# Patient Record
Sex: Male | Born: 1965 | Race: White | Hispanic: No | Smoking: Current every day smoker
Health system: Southern US, Community
[De-identification: ages and names within clinical notes are randomized; demographics above are authoritative.]

## PROBLEM LIST (undated history)

## (undated) DIAGNOSIS — K219 Gastro-esophageal reflux disease without esophagitis: Secondary | ICD-10-CM

## (undated) DIAGNOSIS — I639 Cerebral infarction, unspecified: Secondary | ICD-10-CM

## (undated) DIAGNOSIS — F1020 Alcohol dependence, uncomplicated: Secondary | ICD-10-CM

## (undated) DIAGNOSIS — R011 Cardiac murmur, unspecified: Secondary | ICD-10-CM

## (undated) DIAGNOSIS — R569 Unspecified convulsions: Secondary | ICD-10-CM

## (undated) HISTORY — DX: Alcohol dependence, uncomplicated: F10.20

## (undated) HISTORY — PX: CERVICAL LAMINECTOMY: SHX94

## (undated) HISTORY — DX: Cerebral infarction, unspecified: I63.9

## (undated) HISTORY — PX: BACK SURGERY: SHX140

---

## 2003-04-15 ENCOUNTER — Emergency Department (HOSPITAL_COMMUNITY): Admission: AD | Admit: 2003-04-15 | Discharge: 2003-04-15 | Payer: Self-pay | Admitting: Family Medicine

## 2003-06-09 ENCOUNTER — Ambulatory Visit (HOSPITAL_COMMUNITY): Admission: RE | Admit: 2003-06-09 | Discharge: 2003-06-09 | Payer: Self-pay | Admitting: Orthopedic Surgery

## 2003-07-02 ENCOUNTER — Ambulatory Visit (HOSPITAL_COMMUNITY): Admission: RE | Admit: 2003-07-02 | Discharge: 2003-07-03 | Payer: Self-pay | Admitting: Orthopaedic Surgery

## 2004-05-04 ENCOUNTER — Ambulatory Visit: Payer: Self-pay | Admitting: Family Medicine

## 2005-11-02 ENCOUNTER — Emergency Department (HOSPITAL_COMMUNITY): Admission: EM | Admit: 2005-11-02 | Discharge: 2005-11-03 | Payer: Self-pay | Admitting: Emergency Medicine

## 2005-11-03 ENCOUNTER — Emergency Department (HOSPITAL_COMMUNITY): Admission: EM | Admit: 2005-11-03 | Discharge: 2005-11-03 | Payer: Self-pay | Admitting: Emergency Medicine

## 2007-08-27 ENCOUNTER — Ambulatory Visit: Payer: Self-pay | Admitting: Gastroenterology

## 2007-08-27 LAB — CONVERTED CEMR LAB
Angiotensin 1 Converting Enzyme: 34 units/L (ref 9–67)
Anti Nuclear Antibody(ANA): NEGATIVE
Ferritin: 919.9 ng/mL — ABNORMAL HIGH (ref 22.0–322.0)
INR: 0.9 (ref 0.8–1.0)
Iron: 103 ug/dL (ref 42–165)
Saturation Ratios: 25.9 % (ref 20.0–50.0)
Transferrin: 284.4 mg/dL (ref >=212.0)

## 2007-08-28 ENCOUNTER — Ambulatory Visit: Payer: Self-pay | Admitting: Gastroenterology

## 2007-08-29 ENCOUNTER — Ambulatory Visit (HOSPITAL_COMMUNITY): Admission: RE | Admit: 2007-08-29 | Discharge: 2007-08-29 | Payer: Self-pay | Admitting: Gastroenterology

## 2007-09-27 ENCOUNTER — Ambulatory Visit: Payer: Self-pay | Admitting: Gastroenterology

## 2008-08-28 ENCOUNTER — Emergency Department (HOSPITAL_BASED_OUTPATIENT_CLINIC_OR_DEPARTMENT_OTHER): Admission: EM | Admit: 2008-08-28 | Discharge: 2008-08-28 | Payer: Self-pay | Admitting: Emergency Medicine

## 2008-08-28 ENCOUNTER — Ambulatory Visit: Payer: Self-pay | Admitting: Diagnostic Radiology

## 2010-08-31 LAB — POCT TOXICOLOGY PANEL

## 2010-08-31 LAB — ETHANOL: Alcohol, Ethyl (B): 286 mg/dL — ABNORMAL HIGH (ref 0–10)

## 2010-10-04 NOTE — Assessment & Plan Note (Signed)
[facility]                         GASTROENTEROLOGY OFFICE NOTE   THAD, OSORIA                       MRN:          [id number]  DATE:08/27/2007                            DOB:          Nov 09, 1965    REFERRING PHYSICIAN:  Prudy Feeler, PA-C   REASON FOR CONSULT:  Abnormal liver function tests and reflux symptoms.   HISTORY OF PRESENT ILLNESS:  Mr. Staples is a 45 year old white male who  states he had mild liver function test abnormalities noted in 2007  during an emergency room evaluation for a seizure.  Blood work from  08/08/2007, showed an AST of 301 and an ALT of 188.  The remainder of  his liver function tests were normal.  His cholesterol was elevated at  267 with an HDL of 106 and an LDL of 147.  His CBC, electrolytes and TSH  were unremarkable.  He has no known history of liver disease and there  is no family history of liver disease.  He notes no history of jaundice,  hepatitis, exposure to individuals with jaundice or hepatitis, blood  transfusions or intravenous drug usage.  He is a heavy alcohol drinker  and states he drinks about 12 beers every day.  He states part of the  reason he does this is to control anxiety.  He has had worsening  problems with reflux symptoms over the past year and has tried Prilosec  OTC and ranitidine OTC with only fair results.  He was recently started  on Kapidex which led to severe diarrhea and he noted a small amount of  rectal bleeding with the diarrhea on one occasion.  He discontinued  Kapidex and his diarrhea and rectal bleeding resolved.  Kapidex was very  effective in controlling his reflux symptoms.  He relates a history of  ulcer disease several years ago but does not recall an EGD or UGI  series. He has no history of weight loss, change in appetite, jaundice,  light colored stool, melena, dysphagia, odynophagia, chest pain or  abdominal pain.   FAMILY HISTORY:  Negative for colon cancer,  colon polyps, inflammatory  bowel disease and liver disease.   PAST MEDICAL HISTORY:  1. Hypertension.  2. Hyperlipidemia.  3. Seizure in 2007.  4. History of ulcer disease.   CURRENT MEDICATION:  Celexa 20 mg daily.   MEDICATION ALLERGIES:  1. PENICILLIN.  2. KAPIDEX LEADING TO DIARRHEA.   SOCIAL HISTORY AND REVIEW OF SYSTEMS:  Per the handwritten form.   PHYSICAL EXAM:  A well-developed, well-nourished white male appearing  older than his stated age, weight 155 pounds, height 6 feet 1 inch,  blood pressure is 120/88, pulse 72 and regular.  HEENT EXAM:  Anicteric sclera.  OROPHARYNX:  Clear.  CHEST:  Clear to auscultation bilaterally.  CARDIAC:  Regular rate and rhythm without murmurs appreciated.  ABDOMEN:  Soft, nontender, nondistended, normoactive bowel sounds, no  palpable organomegaly, masses or hernias.  EXTREMITIES:  Without clubbing, cyanosis or edema.  NEUROLOGIC:  Alert and oriented x3.  Grossly nonfocal.   ASSESSMENT AND PLAN:  1. Abnormal liver function test.  The pattern is not typical for      alcohol-induced liver disease but this is a likely contributory      factor.  Rule out fatty infiltration of the liver due to      hyperlipidemia and alcoholism.  Rule out chronic viral hepatitis as      well as genetic and metabolic disease of the liver.  Obtain all      standard serology.  Schedule abdominal ultrasound.  He is strongly      advised to gradually decrease his alcohol usage and to return to      Prudy Feeler, PA-C, for long term management of his alcoholism and      alcohol abstinence.  He is not advised to abruptly discontinue      alcohol on his own due to the rist of withdrawl problems. It      appears likely his anxiety contributes to his alcohol abuse and      this needs future management with Prudy Feeler, PAC.  2. Gastroesophageal reflux disease.  Begin standard antireflux      measures.  Begin pantoprazole 40 mg p.o. q.a.m. taken 30 minutes       before breakfast.  Rule out erosive esophagitis and ulcer disease.      Risks, benefits and alternatives to upper endoscopy with possible      biopsy discussed with the patient and he consents to proceed, this      will be scheduled electively.     Venita Lick. Russella Dar, MD, [facility]  Electronically Signed    MTS/MedQ  DD: 08/27/2007  DT: 08/27/2007  Job #: [id number]   cc:   Prudy Feeler, PA-C

## 2010-10-07 NOTE — Op Note (Signed)
Mitchell May, Mitchell May NO.:  [id number]   MEDICAL RECORD NO.:  [id number]                   PATIENT TYPE:  OIB   LOCATION:  5033                                 FACILITY:  [facility]   PHYSICIAN:  Sharolyn Douglas, M.D.                     DATE OF BIRTH:  27-Apr-1966   DATE OF PROCEDURE:  07/02/2003  DATE OF DISCHARGE:                                 OPERATIVE REPORT   PREOPERATIVE DIAGNOSIS:  Cervical spondylotic radiculopathy C5-C6.   POSTOPERATIVE DIAGNOSIS:  Cervical spondylotic radiculopathy C5-C6.   PROCEDURE:  1. Anterior cervical discectomy C5-C6.  2. Anterior cervical arthrodesis C5-C6 with placement of a 7 mm Nuvasive     allograft prosthesis spacer packed with local autogenous bone graft.  3. Anterior cervical plating C5-C6 utilizing the Spinal Concepts System.   SURGEON:  Sharolyn Douglas, M.D.   ASSISTANT:  Verlin Fester, P.A.   ANESTHESIA:  General endotracheal anesthesia.   COMPLICATIONS:  None.   INDICATIONS FOR PROCEDURE:  The patient is a 45 year old male with right  upper extremity radiculopathy for 4+ months.  Plane radiographs demonstrate  degenerative changes at C5-C6.  MRI scan shows advanced degenerative changes  at C5-C6 with bilateral foraminal narrowing.  There is an HNP at C3-C4 on  the left.  The risks, benefits, and alternatives to ACDF at C5-C6 were  reviewed with the patient.  He understands that he may have persistent  symptoms from the C3-C4 level.  In addition, he is at risk for adjacent  sandwich degeneration.  Nevertheless, he is unable to tolerate his current  symptoms and would like to proceed with surgery.   PROCEDURE:  The patient was properly identified in the holding area and  taken to the operating room.  He underwent general endotracheal anesthesia  without difficulty.  He was carefully positioned on the operating table  using the [place]field head rest.  His neck was placed in slight extension.  He  was placed in 5  pounds of halter traction.  The neck was prepped and draped  in the usual sterile fashion.  A 3 cm incision was made on the left side of  the neck at the level of the cricoid cartilage in a natural skin crease.  Dissection was carried sharply through the platysma.  The interval between  the sternocleidomastoid muscle and the strap muscles medially was developed  down to the prevertebral space.  The C5-C6 level was easily identified by  the large anterior osteophyte.  A spinal needle was placed and x-ray taken  to confirm location.  The esophagus, trachea, and carotid sheath were  identified and protected at all times.  A Leksell rongeur was utilized to  remove the anterior osteophytes at C5-C6.  Caspar distraction pins were  placed in the C5 and C6 vertebral bodies.  The longus colli muscle elevated  out of the C5-C6 disc space.  The disc space was found to be narrowed and  degenerative.  The discectomy was carried back to the posterior longitudinal  ligament.  We encountered large hypertrophic uncovertebral joint.  The  joints were taken down using a high speed bur.  The cartilaginous endplates  also burred down to bleeding cancellous bone.  The posterior vertebral  margins were undercut using a 2 mm Kerrison.  The posterior longitudinal  ligament was taken down.  Wide foraminotomy was completed.  We then placed a  7 mm Nuvasive allograft prosthesis spacer packed with local autogenous bone  graft from the bur shavings.  This was carefully counter sunk 1 mm.  We then  placed a 22 mm Spinal Concepts plate with four 14 mm screws.  We had  excellent screw purchase.  We insured the locking mechanism engaged.  The  wound was irrigated.  The esophagus, trachea, and carotid sheath were  evaluated and there were no apparent injuries.  A deep Penrose drain was  left in place.  The platysma was closed with an interrupted 2-0 Vicryl, the  subcutaneous layer closed with interrupted 3-0 Vicryl, followed  by a 4-0  subcuticular Vicryl suture on the skin.  Benzoin and Steri-Strips were  applied.  A sterile dressing was placed.  A soft collar was placed.  The  patient was extubated without difficulty, transferred to the recovery room,  able to move his upper and lower extremities, in stable condition.                                               Sharolyn Douglas, M.D.    MC/MEDQ  D:  07/02/2003  T:  07/02/2003  Job:  [id number]

## 2011-01-27 ENCOUNTER — Emergency Department (HOSPITAL_COMMUNITY)
Admission: EM | Admit: 2011-01-27 | Discharge: 2011-01-27 | Disposition: A | Discharge status: Home / Self Care | Payer: 59 | Attending: Emergency Medicine | Admitting: Emergency Medicine

## 2011-01-27 ENCOUNTER — Emergency Department (HOSPITAL_COMMUNITY): Payer: 59

## 2011-01-27 DIAGNOSIS — R5381 Other malaise: Secondary | ICD-10-CM | POA: Insufficient documentation

## 2011-01-27 DIAGNOSIS — I10 Essential (primary) hypertension: Secondary | ICD-10-CM | POA: Insufficient documentation

## 2011-01-27 DIAGNOSIS — F17200 Nicotine dependence, unspecified, uncomplicated: Secondary | ICD-10-CM | POA: Insufficient documentation

## 2011-01-27 DIAGNOSIS — R0789 Other chest pain: Secondary | ICD-10-CM | POA: Insufficient documentation

## 2011-01-27 DIAGNOSIS — R7309 Other abnormal glucose: Secondary | ICD-10-CM | POA: Insufficient documentation

## 2011-01-27 DIAGNOSIS — R61 Generalized hyperhidrosis: Secondary | ICD-10-CM | POA: Insufficient documentation

## 2011-01-27 DIAGNOSIS — E860 Dehydration: Secondary | ICD-10-CM | POA: Insufficient documentation

## 2011-01-27 DIAGNOSIS — F1010 Alcohol abuse, uncomplicated: Secondary | ICD-10-CM | POA: Insufficient documentation

## 2011-01-27 DIAGNOSIS — R062 Wheezing: Secondary | ICD-10-CM | POA: Insufficient documentation

## 2011-01-27 LAB — COMPREHENSIVE METABOLIC PANEL
BUN: 10 mg/dL (ref 6–23)
Calcium: 8.9 mg/dL (ref 8.4–10.5)
GFR calc Af Amer: >60 mL/min (ref >60)
Glucose, Bld: 191 mg/dL — ABNORMAL HIGH (ref 70–99)
Sodium: 141 mEq/L (ref 135–145)
Total Protein: 7.7 g/dL (ref 6.0–8.3)

## 2011-01-27 LAB — CBC
MCH: 33.9 pg (ref 26.0–34.0)
MCV: 97.1 fL (ref 78.0–100.0)
Platelets: 195 10*3/uL (ref 150–400)
RDW: 14 % (ref 11.5–15.5)
WBC: 9.1 10*3/uL (ref 4.0–10.5)

## 2011-01-27 LAB — DIFFERENTIAL
Basophils Relative: 1 % (ref 0–1)
Eosinophils Absolute: 0 10*3/uL (ref 0.0–0.7)
Eosinophils Relative: 0 % (ref 0–5)
Lymphs Abs: 1.2 10*3/uL (ref 0.7–4.0)

## 2011-01-27 LAB — URINALYSIS, ROUTINE W REFLEX MICROSCOPIC
Nitrite: NEGATIVE
Specific Gravity, Urine: 1.023 (ref 1.005–1.030)
Urobilinogen, UA: 0.2 mg/dL (ref 0.0–1.0)

## 2011-01-27 LAB — RAPID URINE DRUG SCREEN, HOSP PERFORMED
Amphetamines: NOT DETECTED
Tetrahydrocannabinol: NOT DETECTED

## 2011-01-27 LAB — ETHANOL: Alcohol, Ethyl (B): 129 mg/dL — ABNORMAL HIGH (ref 0–11)

## 2011-01-27 LAB — URINE MICROSCOPIC-ADD ON

## 2011-08-24 ENCOUNTER — Other Ambulatory Visit: Payer: Self-pay | Admitting: Neurosurgery

## 2011-08-25 ENCOUNTER — Encounter (HOSPITAL_COMMUNITY): Payer: Self-pay | Admitting: Pharmacy Technician

## 2011-09-01 ENCOUNTER — Encounter (HOSPITAL_COMMUNITY): Payer: Self-pay

## 2011-09-01 ENCOUNTER — Encounter (HOSPITAL_COMMUNITY)
Admission: RE | Admit: 2011-09-01 | Discharge: 2011-09-01 | Disposition: A | Discharge status: Home / Self Care | Payer: 59 | Source: Ambulatory Visit | Attending: Neurosurgery | Admitting: Neurosurgery

## 2011-09-01 HISTORY — DX: Unspecified convulsions: R56.9

## 2011-09-01 HISTORY — DX: Gastro-esophageal reflux disease without esophagitis: K21.9

## 2011-09-01 HISTORY — DX: Cardiac murmur, unspecified: R01.1

## 2011-09-01 LAB — CBC
HCT: 40.2 % (ref 39.0–52.0)
Hemoglobin: 14.2 g/dL (ref 13.0–17.0)
MCHC: 35.3 g/dL (ref 30.0–36.0)
MCV: 93.5 fL (ref 78.0–100.0)
RDW: 13.8 % (ref 11.5–15.5)

## 2011-09-01 LAB — SURGICAL PCR SCREEN
MRSA, PCR: NEGATIVE
Staphylococcus aureus: NEGATIVE

## 2011-09-01 LAB — BASIC METABOLIC PANEL
BUN: 10 mg/dL (ref 6–23)
Creatinine, Ser: 0.77 mg/dL (ref 0.50–1.35)
GFR calc non Af Amer: >90 mL/min (ref >90)
Glucose, Bld: 93 mg/dL (ref 70–99)
Potassium: 4 mEq/L (ref 3.5–5.1)

## 2011-09-01 LAB — TYPE AND SCREEN

## 2011-09-01 LAB — ABO/RH: ABO/RH(D): AB POS

## 2011-09-01 NOTE — Pre-Procedure Instructions (Addendum)
20 Mitchell May  09/01/2011   Your procedure is scheduled on: 09/07/11  Report to [facility] at 530 AM.  Call this number if you have problems the morning of surgery: [phone]   Remember:   Do not eat food:After Midnight.  May have clear liquids: up to 4 Hours before arrival. 130am  Clear liquids include soda, tea, black coffee, apple or grape juice, broth.  Take these medicines the morning of surgery with A SIP OF WATER hydrocodone, gabapentin STOP any aspirin , nsaids, herbal meds, blood thinners now   Do not wear jewelry, make-up or nail polish.  Do not wear lotions, powders, or perfumes. You may wear deodorant.  Do not shave 48 hours prior to surgery.  Do not bring valuables to the hospital.  Contacts, dentures or bridgework may not be worn into surgery.  Leave suitcase in the car. After surgery it may be brought to your room.  For patients admitted to the hospital, checkout time is 11:00 AM the day of discharge.   Patients discharged the day of surgery will not be allowed to drive home.  Name and phone number of your driver: [name] [phone] cell [phone] hm  Special Instructions: CHG Shower Use Special Wash: 1/2 bottle night before surgery and 1/2 bottle morning of surgery.   Please read over the following fact sheets that you were given: Pain Booklet, Coughing and Deep Breathing, Blood Transfusion Information, MRSA Information and Surgical Site Infection Prevention

## 2011-09-06 MED ORDER — CEFAZOLIN SODIUM 1-5 GM-% IV SOLN
1.0000 g | INTRAVENOUS | Status: DC
  Filled 2011-09-06: qty 50

## 2011-09-07 ENCOUNTER — Encounter (HOSPITAL_COMMUNITY)
Admission: RE | Disposition: A | Discharge status: Home / Self Care | Payer: Self-pay | Source: Ambulatory Visit | Attending: Neurosurgery

## 2011-09-07 ENCOUNTER — Ambulatory Visit (HOSPITAL_COMMUNITY): Payer: 59 | Admitting: Anesthesiology

## 2011-09-07 ENCOUNTER — Inpatient Hospital Stay (HOSPITAL_COMMUNITY)
Admission: RE | Admit: 2011-09-07 | Discharge: 2011-09-09 | DRG: 460 | Disposition: A | Discharge status: Home / Self Care | Payer: 59 | Source: Ambulatory Visit | Attending: Neurosurgery | Admitting: Neurosurgery

## 2011-09-07 ENCOUNTER — Encounter (HOSPITAL_COMMUNITY): Payer: Self-pay | Admitting: Anesthesiology

## 2011-09-07 ENCOUNTER — Ambulatory Visit (HOSPITAL_COMMUNITY): Payer: 59

## 2011-09-07 ENCOUNTER — Encounter (HOSPITAL_COMMUNITY): Payer: Self-pay | Admitting: *Deleted

## 2011-09-07 DIAGNOSIS — M51379 Other intervertebral disc degeneration, lumbosacral region without mention of lumbar back pain or lower extremity pain: Secondary | ICD-10-CM | POA: Diagnosis present

## 2011-09-07 DIAGNOSIS — F17200 Nicotine dependence, unspecified, uncomplicated: Secondary | ICD-10-CM | POA: Diagnosis present

## 2011-09-07 DIAGNOSIS — K219 Gastro-esophageal reflux disease without esophagitis: Secondary | ICD-10-CM | POA: Diagnosis present

## 2011-09-07 DIAGNOSIS — M47817 Spondylosis without myelopathy or radiculopathy, lumbosacral region: Principal | ICD-10-CM | POA: Diagnosis present

## 2011-09-07 DIAGNOSIS — F1010 Alcohol abuse, uncomplicated: Secondary | ICD-10-CM | POA: Diagnosis present

## 2011-09-07 DIAGNOSIS — M5137 Other intervertebral disc degeneration, lumbosacral region: Secondary | ICD-10-CM | POA: Diagnosis present

## 2011-09-07 DIAGNOSIS — Z01812 Encounter for preprocedural laboratory examination: Secondary | ICD-10-CM

## 2011-09-07 SURGERY — POSTERIOR LUMBAR FUSION 1 LEVEL
Anesthesia: General | Site: Spine Lumbar

## 2011-09-07 MED ORDER — SUCCINYLCHOLINE CHLORIDE 20 MG/ML IJ SOLN
INTRAMUSCULAR | Status: DC | PRN
  Administered 2011-09-07: 120 mg via INTRAVENOUS

## 2011-09-07 MED ORDER — GLYCOPYRROLATE 0.2 MG/ML IJ SOLN
INTRAMUSCULAR | Status: DC | PRN
  Administered 2011-09-07: .6 mg via INTRAVENOUS

## 2011-09-07 MED ORDER — NEOSTIGMINE METHYLSULFATE 1 MG/ML IJ SOLN
INTRAMUSCULAR | Status: DC | PRN
  Administered 2011-09-07: 4 mg via INTRAVENOUS

## 2011-09-07 MED ORDER — ALUM & MAG HYDROXIDE-SIMETH 200-200-20 MG/5ML PO SUSP: 30.0000 mL | Freq: Four times a day (QID) | ORAL | Status: DC | PRN

## 2011-09-07 MED ORDER — SODIUM CHLORIDE 0.9 % IJ SOLN
3.0000 mL | Freq: Two times a day (BID) | INTRAMUSCULAR | Status: DC
  Administered 2011-09-07 – 2011-09-08 (×4): 3 mL via INTRAVENOUS

## 2011-09-07 MED ORDER — MORPHINE SULFATE 4 MG/ML IJ SOLN
4.0000 mg | INTRAMUSCULAR | Status: DC | PRN
  Administered 2011-09-07: 4 mg via INTRAMUSCULAR
  Filled 2011-09-07: qty 1

## 2011-09-07 MED ORDER — PHENYLEPHRINE HCL 10 MG/ML IJ SOLN
10.0000 mg | INTRAVENOUS | Status: DC | PRN
  Administered 2011-09-07: 10 ug/min via INTRAVENOUS

## 2011-09-07 MED ORDER — ROCURONIUM BROMIDE 100 MG/10ML IV SOLN
INTRAVENOUS | Status: DC | PRN
  Administered 2011-09-07: 50 mg via INTRAVENOUS
  Administered 2011-09-07: 20 mg via INTRAVENOUS
  Administered 2011-09-07: 30 mg via INTRAVENOUS
  Administered 2011-09-07: 20 mg via INTRAVENOUS
  Administered 2011-09-07: 30 mg via INTRAVENOUS

## 2011-09-07 MED ORDER — MAGNESIUM HYDROXIDE 400 MG/5ML PO SUSP: 30.0000 mL | Freq: Every day | ORAL | Status: DC | PRN

## 2011-09-07 MED ORDER — ESMOLOL HCL 10 MG/ML IV SOLN
INTRAVENOUS | Status: DC | PRN
  Administered 2011-09-07: 30 mg via INTRAVENOUS

## 2011-09-07 MED ORDER — PROPOFOL 10 MG/ML IV EMUL
INTRAVENOUS | Status: DC | PRN
  Administered 2011-09-07: 100 mg via INTRAVENOUS

## 2011-09-07 MED ORDER — LIDOCAINE-EPINEPHRINE 1 %-1:100000 IJ SOLN
INTRAMUSCULAR | Status: DC | PRN
  Administered 2011-09-07: 15 mL

## 2011-09-07 MED ORDER — LIDOCAINE HCL (CARDIAC) 20 MG/ML IV SOLN
INTRAVENOUS | Status: DC | PRN
  Administered 2011-09-07: 100 mg via INTRAVENOUS

## 2011-09-07 MED ORDER — ACETAMINOPHEN 650 MG RE SUPP: 650.0000 mg | RECTAL | Status: DC | PRN

## 2011-09-07 MED ORDER — CYCLOBENZAPRINE HCL 10 MG PO TABS
10.0000 mg | ORAL_TABLET | Freq: Three times a day (TID) | ORAL | Status: DC | PRN
  Administered 2011-09-07 – 2011-09-08 (×2): 10 mg via ORAL
  Filled 2011-09-07 (×2): qty 1

## 2011-09-07 MED ORDER — MORPHINE SULFATE 2 MG/ML IJ SOLN: 0.0500 mg/kg | INTRAMUSCULAR | Status: DC | PRN

## 2011-09-07 MED ORDER — PHENYLEPHRINE HCL 10 MG/ML IJ SOLN
INTRAMUSCULAR | Status: DC | PRN
  Administered 2011-09-07 (×4): 80 ug via INTRAVENOUS

## 2011-09-07 MED ORDER — KETOROLAC TROMETHAMINE 30 MG/ML IJ SOLN: 30.0000 mg | Freq: Once | INTRAMUSCULAR | Status: DC

## 2011-09-07 MED ORDER — HYDROMORPHONE HCL PF 1 MG/ML IJ SOLN
INTRAMUSCULAR | Status: AC
  Filled 2011-09-07: qty 1

## 2011-09-07 MED ORDER — GABAPENTIN 300 MG PO CAPS
300.0000 mg | ORAL_CAPSULE | Freq: Two times a day (BID) | ORAL | Status: DC
  Administered 2011-09-07 – 2011-09-09 (×5): 300 mg via ORAL
  Filled 2011-09-07 (×6): qty 1

## 2011-09-07 MED ORDER — 0.9 % SODIUM CHLORIDE (POUR BTL) OPTIME
TOPICAL | Status: DC | PRN
  Administered 2011-09-07: 1000 mL

## 2011-09-07 MED ORDER — VANCOMYCIN HCL 1000 MG IV SOLR
1000.0000 mg | INTRAVENOUS | Status: DC | PRN
  Administered 2011-09-07: 1000 mg via INTRAVENOUS

## 2011-09-07 MED ORDER — SODIUM CHLORIDE 0.9 % IJ SOLN: 3.0000 mL | INTRAMUSCULAR | Status: DC | PRN

## 2011-09-07 MED ORDER — LACTATED RINGERS IV SOLN
INTRAVENOUS | Status: DC | PRN
  Administered 2011-09-07 (×2): via INTRAVENOUS

## 2011-09-07 MED ORDER — OXYCODONE-ACETAMINOPHEN 5-325 MG PO TABS
1.0000 | ORAL_TABLET | ORAL | Status: DC | PRN
  Administered 2011-09-07 – 2011-09-08 (×4): 2 via ORAL
  Filled 2011-09-07 (×4): qty 2

## 2011-09-07 MED ORDER — BACITRACIN 50000 UNITS IM SOLR
INTRAMUSCULAR | Status: AC
  Filled 2011-09-07: qty 1

## 2011-09-07 MED ORDER — ONDANSETRON HCL 4 MG/2ML IJ SOLN
INTRAMUSCULAR | Status: DC | PRN
  Administered 2011-09-07: 4 mg via INTRAVENOUS

## 2011-09-07 MED ORDER — PHENOL 1.4 % MT LIQD: 1.0000 | OROMUCOSAL | Status: DC | PRN

## 2011-09-07 MED ORDER — HYDROCODONE-ACETAMINOPHEN 5-325 MG PO TABS
1.0000 | ORAL_TABLET | ORAL | Status: DC | PRN
  Administered 2011-09-08 – 2011-09-09 (×3): 2 via ORAL
  Filled 2011-09-07 (×3): qty 2

## 2011-09-07 MED ORDER — BISACODYL 10 MG RE SUPP: 10.0000 mg | Freq: Every day | RECTAL | Status: DC | PRN

## 2011-09-07 MED ORDER — ZOLPIDEM TARTRATE 5 MG PO TABS: 10.0000 mg | ORAL_TABLET | Freq: Every evening | ORAL | Status: DC | PRN

## 2011-09-07 MED ORDER — ACETAMINOPHEN 325 MG PO TABS
650.0000 mg | ORAL_TABLET | ORAL | Status: DC | PRN
  Administered 2011-09-08: 650 mg via ORAL
  Filled 2011-09-07: qty 2

## 2011-09-07 MED ORDER — GENTAMICIN SULFATE 40 MG/ML IJ SOLN
INTRAVENOUS | Status: DC | PRN
  Administered 2011-09-07: 80 mL via INTRAVENOUS

## 2011-09-07 MED ORDER — BUPIVACAINE HCL (PF) 0.5 % IJ SOLN
INTRAMUSCULAR | Status: DC | PRN
  Administered 2011-09-07: 15 mL

## 2011-09-07 MED ORDER — SPIRITUS FRUMENTI
1.0000 | Freq: Three times a day (TID) | ORAL | Status: DC
  Administered 2011-09-07 – 2011-09-09 (×7): 1 via ORAL
  Filled 2011-09-07 (×16): qty 1

## 2011-09-07 MED ORDER — ONDANSETRON HCL 4 MG/2ML IJ SOLN: 4.0000 mg | Freq: Once | INTRAMUSCULAR | Status: DC | PRN

## 2011-09-07 MED ORDER — VANCOMYCIN HCL IN DEXTROSE 1-5 GM/200ML-% IV SOLN
INTRAVENOUS | Status: AC
  Filled 2011-09-07: qty 200

## 2011-09-07 MED ORDER — MIDAZOLAM HCL 5 MG/5ML IJ SOLN
INTRAMUSCULAR | Status: DC | PRN
  Administered 2011-09-07: 2 mg via INTRAVENOUS

## 2011-09-07 MED ORDER — KCL IN DEXTROSE-NACL 20-5-0.45 MEQ/L-%-% IV SOLN
INTRAVENOUS | Status: DC
  Filled 2011-09-07 (×8): qty 1000

## 2011-09-07 MED ORDER — HYDROXYZINE HCL 25 MG PO TABS: 50.0000 mg | ORAL_TABLET | ORAL | Status: DC | PRN

## 2011-09-07 MED ORDER — HYDROMORPHONE HCL PF 1 MG/ML IJ SOLN
0.2500 mg | INTRAMUSCULAR | Status: DC | PRN
  Administered 2011-09-07 (×2): 0.5 mg via INTRAVENOUS

## 2011-09-07 MED ORDER — GENTAMICIN IN SALINE 0.8-0.9 MG/ML-% IV SOLN
80.0000 mg | INTRAVENOUS | Status: DC
  Filled 2011-09-07: qty 100

## 2011-09-07 MED ORDER — FENTANYL CITRATE 0.05 MG/ML IJ SOLN
INTRAMUSCULAR | Status: DC | PRN
  Administered 2011-09-07: 50 ug via INTRAVENOUS
  Administered 2011-09-07: 100 ug via INTRAVENOUS
  Administered 2011-09-07: 50 ug via INTRAVENOUS
  Administered 2011-09-07: 150 ug via INTRAVENOUS
  Administered 2011-09-07: 50 ug via INTRAVENOUS
  Administered 2011-09-07: 100 ug via INTRAVENOUS

## 2011-09-07 MED ORDER — THROMBIN 20000 UNITS EX KIT
PACK | CUTANEOUS | Status: DC | PRN
  Administered 2011-09-07: 09:00:00 via TOPICAL

## 2011-09-07 MED ORDER — KETOROLAC TROMETHAMINE 30 MG/ML IJ SOLN
30.0000 mg | Freq: Four times a day (QID) | INTRAMUSCULAR | Status: DC
  Administered 2011-09-07 – 2011-09-09 (×7): 30 mg via INTRAVENOUS
  Filled 2011-09-07 (×8): qty 1

## 2011-09-07 MED ORDER — MENTHOL 3 MG MT LOZG: 1.0000 | LOZENGE | OROMUCOSAL | Status: DC | PRN

## 2011-09-07 MED ORDER — HYDROXYZINE HCL 50 MG/ML IM SOLN: 50.0000 mg | INTRAMUSCULAR | Status: DC | PRN

## 2011-09-07 MED ORDER — SODIUM CHLORIDE 0.9 % IR SOLN
Status: DC | PRN
  Administered 2011-09-07: 09:00:00

## 2011-09-07 MED ORDER — SODIUM CHLORIDE 0.9 % IV SOLN
INTRAVENOUS | Status: AC
  Filled 2011-09-07: qty 500

## 2011-09-07 SURGICAL SUPPLY — 75 items
ADH SKN CLS APL DERMABOND .7 (GAUZE/BANDAGES/DRESSINGS) ×1
ADH SKN CLS LQ APL DERMABOND (GAUZE/BANDAGES/DRESSINGS) ×1
BAG DECANTER FOR FLEXI CONT (MISCELLANEOUS) ×2 IMPLANT
BLADE SURG ROTATE 9660 (MISCELLANEOUS) IMPLANT
BRUSH SCRUB EZ PLAIN DRY (MISCELLANEOUS) ×2 IMPLANT
BUR ACRON 5.0MM COATED (BURR) ×2 IMPLANT
BUR MATCHSTICK NEURO 3.0 LAGG (BURR) ×2 IMPLANT
CANISTER SUCTION 2500CC (MISCELLANEOUS) ×2 IMPLANT
CAP LCK SPNE (Orthopedic Implant) ×4 IMPLANT
CAP LOCK SPINE RADIUS (Orthopedic Implant) IMPLANT
CAP LOCKING (Orthopedic Implant) ×8 IMPLANT
CLOTH BEACON ORANGE TIMEOUT ST (SAFETY) ×2 IMPLANT
CONT SPEC 4OZ CLIKSEAL STRL BL (MISCELLANEOUS) ×4 IMPLANT
COVER BACK TABLE 24X17X13 BIG (DRAPES) ×1 IMPLANT
COVER TABLE BACK 60X90 (DRAPES) ×1 IMPLANT
DERMABOND ADHESIVE PROPEN (GAUZE/BANDAGES/DRESSINGS) ×1
DERMABOND ADVANCED (GAUZE/BANDAGES/DRESSINGS) ×1
DERMABOND ADVANCED .7 DNX12 (GAUZE/BANDAGES/DRESSINGS) ×1 IMPLANT
DERMABOND ADVANCED .7 DNX6 (GAUZE/BANDAGES/DRESSINGS) ×1 IMPLANT
DRAPE C-ARM 42X72 X-RAY (DRAPES) ×4 IMPLANT
DRAPE LAPAROTOMY 100X72X124 (DRAPES) ×2 IMPLANT
DRAPE POUCH INSTRU U-SHP 10X18 (DRAPES) ×2 IMPLANT
DRAPE PROXIMA HALF (DRAPES) ×1 IMPLANT
DRSG EMULSION OIL 3X3 NADH (GAUZE/BANDAGES/DRESSINGS) IMPLANT
ELECT REM PT RETURN 9FT ADLT (ELECTROSURGICAL) ×2
ELECTRODE REM PT RTRN 9FT ADLT (ELECTROSURGICAL) ×1 IMPLANT
GAUZE SPONGE 4X4 16PLY XRAY LF (GAUZE/BANDAGES/DRESSINGS) IMPLANT
GLOVE BIOGEL PI IND STRL 7.0 (GLOVE) IMPLANT
GLOVE BIOGEL PI IND STRL 8 (GLOVE) ×2 IMPLANT
GLOVE BIOGEL PI INDICATOR 7.0 (GLOVE) ×3
GLOVE BIOGEL PI INDICATOR 8 (GLOVE) ×2
GLOVE ECLIPSE 7.5 STRL STRAW (GLOVE) ×6 IMPLANT
GLOVE EXAM NITRILE LRG STRL (GLOVE) IMPLANT
GLOVE EXAM NITRILE MD LF STRL (GLOVE) ×2 IMPLANT
GLOVE EXAM NITRILE XL STR (GLOVE) IMPLANT
GLOVE EXAM NITRILE XS STR PU (GLOVE) IMPLANT
GLOVE SURG SS PI 6.5 STRL IVOR (GLOVE) ×6 IMPLANT
GLOVE SURG SS PI 7.0 STRL IVOR (GLOVE) ×2 IMPLANT
GOWN BRE IMP SLV AUR LG STRL (GOWN DISPOSABLE) ×5 IMPLANT
GOWN BRE IMP SLV AUR XL STRL (GOWN DISPOSABLE) ×3 IMPLANT
GOWN STRL REIN 2XL LVL4 (GOWN DISPOSABLE) IMPLANT
IMBIBE BONE MARROW ASPIRATION NEEDLE 8GAX15CM ×1 IMPLANT
KIT BASIN OR (CUSTOM PROCEDURE TRAY) ×2 IMPLANT
KIT ROOM TURNOVER OR (KITS) ×2 IMPLANT
MILL MEDIUM DISP (BLADE) ×2 IMPLANT
NDL HYPO 25X1 1.5 SAFETY (NEEDLE) ×1 IMPLANT
NDL SPNL 22GX3.5 QUINCKE BK (NEEDLE) ×1 IMPLANT
NEEDLE BONE MARROW 8GAX6 (NEEDLE) ×1 IMPLANT
NEEDLE HYPO 25X1 1.5 SAFETY (NEEDLE) ×2 IMPLANT
NEEDLE SPNL 18GX3.5 QUINCKE PK (NEEDLE) ×2 IMPLANT
NEEDLE SPNL 22GX3.5 QUINCKE BK (NEEDLE) IMPLANT
NS IRRIG 1000ML POUR BTL (IV SOLUTION) ×2 IMPLANT
PACK LAMINECTOMY NEURO (CUSTOM PROCEDURE TRAY) ×2 IMPLANT
PAD ARMBOARD 7.5X6 YLW CONV (MISCELLANEOUS) ×8 IMPLANT
PATTIES SURGICAL .5 X.5 (GAUZE/BANDAGES/DRESSINGS) IMPLANT
PATTIES SURGICAL .5 X1 (DISPOSABLE) IMPLANT
PATTIES SURGICAL 1X1 (DISPOSABLE) IMPLANT
PEEK PLIF AVS 14X20X8 (Peek) ×4 IMPLANT
ROD RADIUS 35MM (Rod) ×2 IMPLANT
SCREW 6.75X40MM (Screw) ×2 IMPLANT
SCREW RADIUS 5.75X55MM (Screw) ×2 IMPLANT
SPONGE GAUZE 4X4 12PLY (GAUZE/BANDAGES/DRESSINGS) ×2 IMPLANT
SPONGE LAP 4X18 X RAY DECT (DISPOSABLE) IMPLANT
SPONGE NEURO XRAY DETECT 1X3 (DISPOSABLE) ×2 IMPLANT
SPONGE SURGIFOAM ABS GEL 100 (HEMOSTASIS) ×2 IMPLANT
STRIP BIOACTIVE VITOSS 25X100X (Neuro Prosthesis/Implant) ×1 IMPLANT
SUT VIC AB 1 CT1 18XBRD ANBCTR (SUTURE) ×2 IMPLANT
SUT VIC AB 1 CT1 8-18 (SUTURE) ×4
SUT VIC AB 2-0 CP2 18 (SUTURE) ×4 IMPLANT
SYR 20ML ECCENTRIC (SYRINGE) ×2 IMPLANT
TAPE CLOTH SURG 4X10 WHT LF (GAUZE/BANDAGES/DRESSINGS) ×1 IMPLANT
TOWEL OR 17X24 6PK STRL BLUE (TOWEL DISPOSABLE) ×2 IMPLANT
TOWEL OR 17X26 10 PK STRL BLUE (TOWEL DISPOSABLE) ×2 IMPLANT
TRAY FOLEY CATH 14FRSI W/METER (CATHETERS) ×2 IMPLANT
WATER STERILE IRR 1000ML POUR (IV SOLUTION) ×2 IMPLANT

## 2011-09-07 NOTE — H&P (Signed)
Subjective: Patient is a 46 y.o. 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 He is admitted for an L5 Gill procedure, and bilateral L5-S1 posterior lumbar interbody arthrodesis with interbody implants and bone graft and a bilateral L5-S1 posterior lateral arthrodesis with with posterior instrumentation and bone graft. Patient's been having difficulties with back pain for several years worse over the past year. He has undergone nonsurgical management with medications and spinal injections without relief.   Past Medical History  Diagnosis Date  . Heart murmur   . GERD (gastroesophageal reflux disease)   . Seizures     one >10 yrs ? reason    Past Surgical History  Procedure Date  . Cervical laminectomy     Prescriptions prior to admission  Medication Sig Dispense Refill  . gabapentin (NEURONTIN) 300 MG capsule Take 300 mg by mouth 2 (two) times daily as needed. For nerve pain      . HYDROcodone-acetaminophen (NORCO) 10-325 MG per tablet Take 1 tablet by mouth every 6 (six) hours as needed. For pain      . nabumetone (RELAFEN) 500 MG tablet Take 500 mg by mouth as needed.       Allergies  Allergen Reactions  . Penicillins Other (See Comments)    Childhood reaction    History  Substance Use Topics  . Smoking status: Current Everyday Smoker -- 1.0 packs/day for 25 years    Types: Cigarettes  . Smokeless tobacco: Not on file   Comment: 6pk - 12pk a day beer  . Alcohol Use: Yes    History reviewed. No pertinent family history.   Review of Systems A comprehensive review of systems was negative.  Objective: Vital signs in last 24 hours: Temp:  [98.1 F (36.7 C)] 98.1 F (36.7 C) (04/18 0609) Pulse Rate:  [63] 63  (04/18 0609) Resp:  [16] 16  (04/18 0609) BP: (127)/(87) 127/87 mmHg (04/18 0609) SpO2:  [97 %] 97 % (04/18 0609)  EXAM: Patient is a thin man in no acute distress. Lungs are clear  to auscultation , the patient has symmetrical respiratory excursion. Heart has a regular rate and rhythm normal S1 and S2 no murmur.   Abdomen is soft nontender nondistended bowel sounds are present. Extremity examination shows no clubbing cyanosis or edema. Musculoskeletal examination shows tenderness to palpation middle lumbar sacral junction. He is able to flex to 90. He is able to extend to 10. Straight leg raising is negative bilaterally. Motor examination shows 5 over 5 strength in the lower extremities including the iliopsoas quadriceps dorsiflexor extensor hallicus  longus and plantar flexor bilaterally. Sensation is intact to pinprick in the distal lower extremities. Reflexes are symmetrical bilaterally. No pathologic reflexes are present. Patient has a normal gait and stance.   Data Review:CBC    Component Value Date/Time   WBC 10.7* 09/01/2011 0859   RBC 4.30 09/01/2011 0859   HGB 14.2 09/01/2011 0859   HCT 40.2 09/01/2011 0859   PLT 221 09/01/2011 0859   MCV 93.5 09/01/2011 0859   MCH 33.0 09/01/2011 0859   MCHC 35.3 09/01/2011 0859   RDW 13.8 09/01/2011 0859   LYMPHSABS 1.2 01/27/2011 1224   MONOABS 0.2 01/27/2011 1224   EOSABS 0.0 01/27/2011 1224   BASOSABS 0.1 01/27/2011 1224                          BMET  Component Value Date/Time   NA 139 09/01/2011 0859   K 4.0 09/01/2011 0859   CL 101 09/01/2011 0859   CO2 27 09/01/2011 0859   GLUCOSE 93 09/01/2011 0859   BUN 10 09/01/2011 0859   CREATININE 0.77 09/01/2011 0859   CALCIUM 9.1 09/01/2011 0859   GFRNONAA >90 09/01/2011 0859   GFRAA >90 09/01/2011 0859     Assessment/Plan: Patient with the L5-S1 spinal listhesis and at the bilateral L5 pars interarticularis defect. There is bilateral L5-S1 neural foraminal stenosis. The listhesis seems static through flexion-extension. Patient is admitted now for lumbar decompression and arthrodesis. I've discussed with the patient the nature of his condition, the nature the surgical procedure, the  typical length of surgery, hospital stay, and overall recuperation, the limitations postoperatively, and risks of surgery. I discussed risks including risks of infection, bleeding, possibly need for transfusion, the risk of nerve root dysfunction with pain, weakness, numbness, or paresthesias, the risk of dural tear and CSF leakage and possible need for further surgery, the risk of failure of the arthrodesis and possibly for further surgery, the risk of anesthetic complications including myocardial infarction, stroke, pneumonia, and death. We discussed the need for postoperative immobilization in a lumbar brace. Understanding all this the patient does wish to proceed with surgery and is admitted for such.     Hewitt Shorts, MD 09/07/2011 7:13 AM

## 2011-09-07 NOTE — Addendum Note (Signed)
Addendum  created 09/07/11 1305 by Jeani Hawking, CRNA   Modules edited:Anesthesia Medication Administration

## 2011-09-07 NOTE — Anesthesia Postprocedure Evaluation (Signed)
Anesthesia Post Note  Patient: Mitchell May  Procedure(s) Performed: Procedure(s) (LRB): POSTERIOR LUMBAR FUSION 1 LEVEL (N/A)  Anesthesia type: general  Patient location: PACU  Post pain: Pain level controlled  Post assessment: Patient's Cardiovascular Status Stable  Last Vitals:  Filed Vitals:   09/07/11 1233  BP:   Pulse: 99  Temp:   Resp: 15    Post vital signs: Reviewed and stable  Level of consciousness: sedated  Complications: No apparent anesthesia complications

## 2011-09-07 NOTE — Anesthesia Preprocedure Evaluation (Addendum)
Anesthesia Evaluation  Patient identified by MRN, date of birth, ID band Patient awake    Reviewed: Allergy & Precautions, H&P , NPO status , Patient's Chart, lab work & pertinent test results, reviewed documented beta blocker date and time   Airway Mallampati: II TM Distance: >3 FB Neck ROM: Limited    Dental  (+) Poor Dentition, Dental Advisory Given and Chipped   Pulmonary Current Smoker,          Cardiovascular negative cardio ROS  - Valvular Problems/Murmurs    Neuro/Psych Seizures -, Well Controlled,  negative psych ROS   GI/Hepatic Neg liver ROS, GERD-  Poorly Controlled,(+)     substance abuse (12 pack per day)  alcohol use,   Endo/Other  negative endocrine ROS  Renal/GU negative Renal ROS  negative genitourinary   Musculoskeletal   Abdominal   Peds  (+) Abnormal pediatric history - Hematology negative hematology ROS (+)   Anesthesia Other Findings   Reproductive/Obstetrics negative OB ROS                        Anesthesia Physical Anesthesia Plan  ASA: II  Anesthesia Plan: General   Post-op Pain Management:    Induction: Intravenous, Rapid sequence and Cricoid pressure planned  Airway Management Planned: Oral ETT  Additional Equipment:   Intra-op Plan:   Post-operative Plan: Extubation in OR  Informed Consent: I have reviewed the patients History and Physical, chart, labs and discussed the procedure including the risks, benefits and alternatives for the proposed anesthesia with the patient or authorized representative who has indicated his/her understanding and acceptance.   Dental advisory given  Plan Discussed with: CRNA and Surgeon  Anesthesia Plan Comments:       Anesthesia Quick Evaluation

## 2011-09-07 NOTE — Transfer of Care (Signed)
Immediate Anesthesia Transfer of Care Note  Patient: Mitchell May  Procedure(s) Performed: Procedure(s) (LRB): POSTERIOR LUMBAR FUSION 1 LEVEL (N/A)  Patient Location: PACU  Anesthesia Type: General  Level of Consciousness: awake, alert , oriented and patient cooperative  Airway & Oxygen Therapy: Patient Spontanous Breathing and Patient connected to face mask oxygen  Post-op Assessment: Report given to PACU RN and Post -op Vital signs reviewed and stable  Post vital signs: Reviewed and stable  Complications: No apparent anesthesia complications

## 2011-09-07 NOTE — Addendum Note (Signed)
Addendum  created 09/07/11 1305 by Falynn Ailey J Nabor Thomann, CRNA   Modules edited:Anesthesia Medication Administration    

## 2011-09-07 NOTE — Op Note (Signed)
09/07/2011  11:56 AM  PATIENT:  Mitchell May  46 y.o. male  PRE-OPERATIVE DIAGNOSIS:  L5 bilateral pars interarticularis defect, L5 on S1 spondylolisthesis, lumbar spondylosis, lumbar degenerative disc disease, lumbago  POST-OPERATIVE DIAGNOSIS: L5 bilateral pars interarticularis defect, L5 on S1 Spondylolisthesis, Lumbar Spondylosis,,Lumbar degenerative disc disease, lumbago  PROCEDURE:  Procedure(s): POSTERIOR LUMBAR FUSION 1 LEVEL: L5 Gill procedure, bilateral L5-S1 facetectomy and foraminotomies with decompression of the exiting L5 and S1 nerve roots bilaterally, with decompression beyond that required for interbody arthrodesis, with microdissection and microsurgical technique, with bilateral L5-S1 posterior lumbar interbody arthrodesis with AVS peek interbody implants, locally harvested morcellized autograft, and Vitoss with bone marrow aspirate, and bilateral L5-S1 posterior lateral arthrodesis with radius posterior instrumentation and Vitoss with bone marrow aspirate  SURGEON:  Surgeon(s): Hewitt Shorts, MD Clydene Fake, MD  ASSISTANTS: Clydene Fake, M.D.  ANESTHESIA:   general  EBL:  Total I/O In: 1900 [I.V.:1900] Out: 920 [Urine:700; Blood:220]  BLOOD ADMINISTERED:none  COUNT: Incorrect per nursing staff, an extra 4 x 18 sponge was found by the nursing staff during their final count at wound closure. Fluoroscopy images showed no sponges within the surgical wound.  DICTATION: Patient is brought to the operating room placed under general endotracheal anesthesia. The patient was turned to prone position the lumbar region was prepped with Betadine soap and solution and draped in a sterile fashion. The midline was infiltrated with local anesthesia with epinephrine. A localizing x-ray was taken and then a midline incision is made carried down to subcutaneous tissue bipolar cautery and electrocautery were used to maintain hemostasis dissection was carried out the lumbar  fascia. The fascia was incised bilaterally and the paraspinal muscles were dissected with a spinous process and lamina in a subperiosteal fashion. Another x-ray was taken for localization and the L5-S1 level was localized. Dissection was then carried out laterally over the facet complex and the transverse processes of L5 and the ala of S1 were exposed and decorticated. Decompression via an L5 Gill procedure was performed. We dissected around the loose posterior element of L5. It was freed up from the ligamentum flavum at the L4-5 as well as L5-S1 levels, and it was freed up from the pseudoarthrosis that was present. It is removed en bloc and cleaned of cartilage and soft tissue by the nursing staff and processed through a bone mill to be used as morcellized autograft. Dissection was carried out laterally including facetectomy and foraminotomies with decompression of the L5 nerve roots bilaterally. There was moderate pseudoarthrotic bone, cartilage, and ligament tissue that was overgrown and was carefully removed so as to decompress the exiting nerve roots. Once the decompression of the thecal sac and exiting nerve roots was completed we proceeded with the posterior lumbar interbody arthrodesis. The annulus was incised bilaterally and the disc space entered a thorough discectomy was performed using pituitary rongeurs and curettes. There was a significant persistent listhesis of L5 anteriorly on S1. Once the discectomy was completed we began to prepare the endplate surfaces removing the cartilaginous endplates surface with paddle curettes. We then measured the height of the intervertebral disc space. We selected 4 x 20 x 8 by 11 mm AVS peek interbody implants.  The C-arm fluoroscope was then draped and brought in the field and we identified the pedicle entry points bilaterally at the S1 levels. Each of the S1 pedicles was probed, we aspirated bone marrow aspirate from the vertebral bodies this was injected over a  10 cc strip of  Vitoss. Then each of the pedicles was examined with the ball probe good bony surfaces were found and no bony cuts were found. Each of the S1 pedicles was then tapped with a 6.25 mm tap, again examined with the ball probe good threading was found and no bony cuts were found. We then placed 6.75 by 40 millimeter screws bilaterally at the S1 level.  We then packed the AVS peek interbody implants with locally harvested morcellized autograft, and then placed the first implant and on the right side, carefully retracting the thecal sac and nerve root medially. We then went back to the left side and packed the midline with additional locally harvested morcellized autograft and then placed a second implant and on the left side again retracting the thecal sac and nerve root medially. Vitoss with bone marrow aspirate was packed lateral to the implants.  We then placed pedicle screws bilaterally at L5, using C-arm fluoroscopic guidance. Pedicle entry points were identified bilaterally at L5. Each of the pedicles was probed, and then tapped with a 5.25 mm tap, exam with a ball probe, good threading was found, and then we placed 5.75 x 55 mm screws bilaterally at L5.  We then packed the lateral gutter over the transverse processes and intertransverse space with Vitoss with bone marrow aspirate. We then selected a 35 mm pre-lordosed rods, they were placed within the screw heads and secured with locking caps once all 4 locking caps were placed final tightening was performed against a counter torque.  The wound had been irrigated multiple times during the procedure with saline solution and bacitracin solution, good hemostasis was established with a combination of bipolar cautery. Once good hemostasis was confirmed we proceeded with closure paraspinal muscles deep fascia and Scarpa's fascia were closed with interrupted undyed 1 Vicryl sutures the subcutaneous and subcuticular closed with interrupted inverted  2-0 undyed Vicryl sutures the skin edges were approximated with Dermabond. A dressing of dry gauze was applied once the Dermabond was dried.  Following surgery the patient was turned back to the supine position to be reversed and the anesthetic extubated and transferred to the recovery room for further care.   PLAN OF CARE: Admit to inpatient   PATIENT DISPOSITION:  PACU - hemodynamically stable.   Delay start of Pharmacological VTE agent (>24hrs) due to surgical blood loss or risk of bleeding:  yes

## 2011-09-07 NOTE — Progress Notes (Signed)
Filed Vitals:   09/07/11 1238 09/07/11 1239 09/07/11 1251 09/07/11 1307  BP:  105/60  103/71  Pulse: 94 92 91 85  Temp:   97.9 F (36.6 C) 97.8 F (36.6 C)  TempSrc:      Resp: 13 14 13 20   SpO2: 95% 94% 94% 96%    Patient seen on 3500 unit. Comfortable. Dressing clean dry. Mobilizing patient with staff to ambulate in the halls. They will DC Foley later today. Moving all extremities well.  Plan: To progress through postoperative recovery. We'll have dressing removed on second postoperative day (April 20).  Hewitt Shorts, MD 09/07/2011, 1:51 PM

## 2011-09-08 NOTE — Progress Notes (Signed)
Doing well. C/o appropriate incisional soreness. Less leg pain No Numbness, tingling, weakness No Nausea /vomiting Amb/ voiding well  Temp:  [97.8 F (36.6 C)-100.2 F (37.9 C)] 98.8 F (37.1 C) (04/19 0801) Pulse Rate:  [80-104] 88  (04/19 0801) Resp:  [13-20] 16  (04/19 0801) BP: (103-139)/(60-84) 123/84 mmHg (04/19 0801) SpO2:  [94 %-98 %] 95 % (04/19 0801) Good strength and sensation Incision CDI  Plan: Increase activity  , ?home in 1-2 days?

## 2011-09-08 NOTE — Progress Notes (Signed)
Utilization review completed. Aarik Blank, RN, BSN. 09/08/11 

## 2011-09-09 MED ORDER — OXYCODONE-ACETAMINOPHEN 5-325 MG PO TABS: 1.0000 | ORAL_TABLET | ORAL | Status: AC | PRN

## 2011-09-09 MED ORDER — CYCLOBENZAPRINE HCL 10 MG PO TABS: 10.0000 mg | ORAL_TABLET | Freq: Three times a day (TID) | ORAL | Status: AC | PRN

## 2011-09-09 NOTE — Discharge Summary (Signed)
Physician Discharge Summary  Patient ID: Mitchell May MRN: [id number] DOB/AGE: Jun 22, 1965 46 y.o.  Admit date: 09/07/2011 Discharge date: 09/09/2011  Admission Diagnoses: L5 bilateral pars interarticularis defect, L5 on S1 spondylolisthesis, lumbar spondylosis, lumbar degenerative disc disease, lumbago  Discharge Diagnoses: L5 bilateral pars interarticularis defect, L5 on S1 spondylolisthesis, lumbar spondylosis, lumbar degenerative disc disease, lumbago  Active Problems:  * No active hospital problems. *    Discharged Condition: good  Hospital Course: pt admitted and underwent surgery below.  Pt doing well with less leg pain. Ambulating, and voiding well  Consults: None  Significant Diagnostic Studies: none  Treatments: surgery: POSTERIOR LUMBAR FUSION 1 LEVEL: L5 Gill procedure, bilateral L5-S1 facetectomy and foraminotomies with decompression of the exiting L5 and S1 nerve roots bilaterally, with decompression beyond that required for interbody arthrodesis, with microdissection and microsurgical technique, with bilateral L5-S1 posterior lumbar interbody arthrodesis with AVS peek interbody implants, locally harvested morcellized autograft, and Vitoss with bone marrow aspirate, and bilateral L5-S1 posterior lateral arthrodesis with radius posterior instrumentation and Vitoss with bone marrow aspirate   Discharge Exam: Blood pressure 134/92, pulse 98, temperature 98 F (36.7 C), temperature source Oral, resp. rate 18, SpO2 98.00%. Wound:c/d/i  Disposition: home   Medication List  As of 09/09/2011  9:29 AM   STOP taking these medications         HYDROcodone-acetaminophen 10-325 MG per tablet      nabumetone 500 MG tablet         TAKE these medications         cyclobenzaprine 10 MG tablet   Commonly known as: FLEXERIL   Take 1 tablet (10 mg total) by mouth 3 (three) times daily as needed for muscle spasms.      gabapentin 300 MG capsule   Commonly known as: NEURONTIN    Take 300 mg by mouth 2 (two) times daily as needed. For nerve pain      oxyCODONE-acetaminophen 5-325 MG per tablet   Commonly known as: PERCOCET   Take 1-2 tablets by mouth every 4 (four) hours as needed.             SignedClydene Fake, MD 09/09/2011, 9:29 AM

## 2011-09-09 NOTE — Discharge Instructions (Signed)
Wound Care °Leave incision open to air. °You may shower. °Do not scrub directly on incision.  °Do not put any creams, lotions, or ointments on incision. °Activity °Walk each and every day, increasing distance each day. °No lifting greater than 5 lbs.  Avoid bending, arching, and twisting. °No driving for 2 weeks; may ride as a passenger locally. °If provided with back brace, wear when out of bed.  It is not necessary to wear in bed. °Diet °Resume your normal diet.  °Return to Work °Will be discussed at you follow up appointment. °Call Your Doctor If Any of These Occur °Redness, drainage, or swelling at the wound.  °Temperature greater than 101 degrees. °Severe pain not relieved by pain medication. °Incision starts to come apart. °Follow Up Appt °Call today for appointment in 3 weeks ([phone]) or for problems.  If you have any hardware placed in your spine, you will need an x-ray before your appointment. °

## 2011-09-11 MED FILL — Sodium Chloride Irrigation Soln 0.9%: Qty: 3000 | Status: AC

## 2011-09-11 MED FILL — Sodium Chloride IV Soln 0.9%: INTRAVENOUS | Qty: 1000 | Status: AC

## 2011-09-11 MED FILL — Heparin Sodium (Porcine) Inj 1000 Unit/ML: INTRAMUSCULAR | Qty: 30 | Status: AC

## 2013-02-10 ENCOUNTER — Other Ambulatory Visit: Payer: Self-pay | Admitting: Neurosurgery

## 2013-02-10 DIAGNOSIS — M5137 Other intervertebral disc degeneration, lumbosacral region: Secondary | ICD-10-CM

## 2013-02-10 DIAGNOSIS — M47817 Spondylosis without myelopathy or radiculopathy, lumbosacral region: Secondary | ICD-10-CM

## 2013-02-19 ENCOUNTER — Ambulatory Visit
Admission: RE | Admit: 2013-02-19 | Discharge: 2013-02-19 | Disposition: A | Discharge status: Home / Self Care | Payer: 59 | Source: Ambulatory Visit | Attending: Neurosurgery | Admitting: Neurosurgery

## 2013-02-19 DIAGNOSIS — M47817 Spondylosis without myelopathy or radiculopathy, lumbosacral region: Secondary | ICD-10-CM

## 2013-02-19 DIAGNOSIS — M5137 Other intervertebral disc degeneration, lumbosacral region: Secondary | ICD-10-CM

## 2013-02-19 MED ORDER — GADOBENATE DIMEGLUMINE 529 MG/ML IV SOLN
14.0000 mL | Freq: Once | INTRAVENOUS | Status: AC | PRN
  Administered 2013-02-19: 14 mL via INTRAVENOUS

## 2014-06-01 ENCOUNTER — Encounter: Payer: Self-pay | Admitting: Gastroenterology

## 2016-02-09 ENCOUNTER — Ambulatory Visit (INDEPENDENT_AMBULATORY_CARE_PROVIDER_SITE_OTHER): Payer: 59 | Admitting: Physician Assistant

## 2016-02-09 ENCOUNTER — Encounter: Payer: Self-pay | Admitting: Physician Assistant

## 2016-02-09 VITALS — BP 132/89 | HR 95 | Temp 97.3°F | Ht 72.0 in | Wt 149.8 lb

## 2016-02-09 DIAGNOSIS — F1010 Alcohol abuse, uncomplicated: Secondary | ICD-10-CM | POA: Insufficient documentation

## 2016-02-09 DIAGNOSIS — J411 Mucopurulent chronic bronchitis: Secondary | ICD-10-CM | POA: Insufficient documentation

## 2016-02-09 DIAGNOSIS — F17200 Nicotine dependence, unspecified, uncomplicated: Secondary | ICD-10-CM

## 2016-02-09 DIAGNOSIS — Z0000 Encounter for general adult medical examination without abnormal findings: Secondary | ICD-10-CM | POA: Insufficient documentation

## 2016-02-09 DIAGNOSIS — Z23 Encounter for immunization: Secondary | ICD-10-CM | POA: Diagnosis not present

## 2016-02-09 DIAGNOSIS — M5136 Other intervertebral disc degeneration, lumbar region: Secondary | ICD-10-CM | POA: Insufficient documentation

## 2016-02-09 DIAGNOSIS — J309 Allergic rhinitis, unspecified: Secondary | ICD-10-CM

## 2016-02-09 DIAGNOSIS — N529 Male erectile dysfunction, unspecified: Secondary | ICD-10-CM | POA: Diagnosis not present

## 2016-02-09 MED ORDER — GABAPENTIN 300 MG PO CAPS: 300.0000 mg | ORAL_CAPSULE | Freq: Two times a day (BID) | ORAL | 2 refills | Status: DC | PRN

## 2016-02-09 MED ORDER — FLUTICASONE FUROATE-VILANTEROL 200-25 MCG/INH IN AEPB: 1.0000 | INHALATION_SPRAY | Freq: Every day | RESPIRATORY_TRACT | 2 refills | Status: DC

## 2016-02-09 MED ORDER — SILDENAFIL CITRATE 100 MG PO TABS: 100.0000 mg | ORAL_TABLET | Freq: Every day | ORAL | 6 refills | Status: DC | PRN

## 2016-02-09 MED ORDER — LORATADINE 10 MG PO TABS: 10.0000 mg | ORAL_TABLET | Freq: Every day | ORAL | 11 refills | Status: DC

## 2016-02-09 MED ORDER — HYDROCODONE-ACETAMINOPHEN 10-325 MG PO TABS: 1.0000 | ORAL_TABLET | Freq: Three times a day (TID) | ORAL | 0 refills | Status: DC | PRN

## 2016-02-09 MED ORDER — HYDROCODONE-ACETAMINOPHEN 10-325 MG PO TABS: 1.0000 | ORAL_TABLET | Freq: Four times a day (QID) | ORAL | 0 refills | Status: DC | PRN

## 2016-02-09 NOTE — Patient Instructions (Signed)

## 2016-02-09 NOTE — Progress Notes (Signed)
BP 132/89   Pulse 95   Temp 97.3 F (36.3 C) (Oral)   Ht 6' (1.829 m)   Wt 149 lb 12.8 oz (67.9 kg)   BMI 20.32 kg/m    Subjective:    Patient ID: Mitchell May, male    DOB: 1965-12-22, 50 y.o.   MRN: [id number]  Mitchell May is a 50 y.o. male presenting on 02/09/2016 for Follow-up (also has forms that need to be filled out for labs )   HPI Patient here to be established as new patient at [facility].  This patient is known to me from [facility]. His past medical history is positive for degenerative disc disease and lumbar surgery to repair it, chronic back pain and neuropathy, alcohol abuse, tobacco abuse, depression, GERD. All of his medications are reviewed today. He will have labs performed today. At this time he has daily wheezing. He has daily mucus that he coughs up. He denies any fever or chills. He denies any color or blood in his sputum. He has greater than a 25-pack-year history of smoking.  He is also having daily allergies and congestion. He taken Claritin in the past but has not been on it at this time. We'll plan for spirometry at this time. One last issue has been decreased ability to keep an erection. He has never tried any medicines before he states he does have libido but cannot keep an erection through intercourse. And also states it is much less hard than normal.   Relevant past medical, surgical, family and social history reviewed and updated as indicated. Interim medical history since our last visit reviewed. Allergies and medications reviewed and updated.   Data reviewed from any sources in EPIC.  Review of Systems  Constitutional: Positive for fatigue. Negative for appetite change.  HENT: Negative.   Eyes: Negative.  Negative for pain and visual disturbance.  Respiratory: Positive for cough, shortness of breath and wheezing. Negative for chest tightness.   Cardiovascular: Negative.  Negative for chest pain,  palpitations and leg swelling.  Gastrointestinal: Negative.  Negative for abdominal pain, diarrhea, nausea and vomiting.  Endocrine: Negative.   Genitourinary: Negative.   Musculoskeletal: Positive for arthralgias, back pain and myalgias.  Skin: Negative.  Negative for color change and rash.  Neurological: Negative.  Negative for weakness, numbness and headaches.  Psychiatric/Behavioral: Positive for decreased concentration and dysphoric mood.    Per HPI unless specifically indicated above  Social History   Social History  . Marital status: Married    Spouse name: N/A  . Number of children: N/A  . Years of education: N/A   Occupational History  . Not on file.   Social History Main Topics  . Smoking status: Current Every Day Smoker    Packs/day: 1.00    Years: 25.00    Types: Cigarettes  . Smokeless tobacco: Never Used     Comment: 6pk - 12pk a day beer  . Alcohol use Yes  . Drug use: No  . Sexual activity: Not on file   Other Topics Concern  . Not on file   Social History Narrative  . No narrative on file    Past Surgical History:  Procedure Laterality Date  . CERVICAL LAMINECTOMY      History reviewed. No pertinent family history.    Medication List       Accurate as of 02/09/16 11:47 AM. Always use your most recent med list.  fluticasone furoate-vilanterol 200-25 MCG/INH Aepb Commonly known as:  BREO ELLIPTA Inhale 1 puff into the lungs daily.   gabapentin 300 MG capsule Commonly known as:  NEURONTIN Take 1 capsule (300 mg total) by mouth 2 (two) times daily as needed. For nerve pain   HYDROcodone-acetaminophen 10-325 MG tablet Commonly known as:  NORCO Take 1 tablet by mouth every 6 (six) hours as needed.   HYDROcodone-acetaminophen 10-325 MG tablet Commonly known as:  NORCO Take 1 tablet by mouth every 8 (eight) hours as needed.   HYDROcodone-acetaminophen 10-325 MG tablet Commonly known as:  NORCO Take 1 tablet by mouth every 8  (eight) hours as needed.   loratadine 10 MG tablet Commonly known as:  CLARITIN Take 1 tablet (10 mg total) by mouth daily.   sildenafil 100 MG tablet Commonly known as:  VIAGRA Take 1 tablet (100 mg total) by mouth daily as needed for erectile dysfunction.          Objective:    BP 132/89   Pulse 95   Temp 97.3 F (36.3 C) (Oral)   Ht 6' (1.829 m)   Wt 149 lb 12.8 oz (67.9 kg)   BMI 20.32 kg/m   Allergies  Allergen Reactions  . Penicillins Other (See Comments)    Childhood reaction   Wt Readings from Last 3 Encounters:  02/09/16 149 lb 12.8 oz (67.9 kg)  09/01/11 149 lb 14.4 oz (68 kg)    Physical Exam  Constitutional: He appears well-developed and well-nourished.  HENT:  Head: Normocephalic and atraumatic.  Eyes: Conjunctivae and EOM are normal. Pupils are equal, round, and reactive to light.  Neck: Normal range of motion. Neck supple.  Cardiovascular: Normal rate, regular rhythm and normal heart sounds.   Pulmonary/Chest: Effort normal. He has no decreased breath sounds. He has wheezes in the right upper field, the right middle field, the right lower field, the left upper field, the left middle field and the left lower field. He has no rhonchi. He has no rales.  Abdominal: Soft. Bowel sounds are normal.  Musculoskeletal:       Lumbar back: He exhibits decreased range of motion, tenderness, deformity, pain and spasm.       Back:  Skin: Skin is warm and dry.  Nursing note and vitals reviewed.       Assessment & Plan:   1. DDD (degenerative disc disease), lumbar - gabapentin (NEURONTIN) 300 MG capsule; Take 1 capsule (300 mg total) by mouth 2 (two) times daily as needed. For nerve pain  Dispense: 60 capsule; Refill: 2 - HYDROcodone-acetaminophen (NORCO) 10-325 MG tablet; Take 1 tablet by mouth every 6 (six) hours as needed.  Dispense: 120 tablet; Refill: 0 - HYDROcodone-acetaminophen (NORCO) 10-325 MG tablet; Take 1 tablet by mouth every 8 (eight) hours as  needed.  Dispense: 120 tablet; Refill: 0 - HYDROcodone-acetaminophen (NORCO) 10-325 MG tablet; Take 1 tablet by mouth every 8 (eight) hours as needed.  Dispense: 120 tablet; Refill: 0  2. Allergic rhinitis, unspecified allergic rhinitis type - loratadine (CLARITIN) 10 MG tablet; Take 1 tablet (10 mg total) by mouth daily.  Dispense: 30 tablet; Refill: 11  3. Mucopurulent chronic bronchitis (HCC) - fluticasone furoate-vilanterol (BREO ELLIPTA) 200-25 MCG/INH AEPB; Inhale 1 puff into the lungs daily.  Dispense: 60 each; Refill: 2 - PR BREATHING CAPACITY TEST  4. Erectile dysfunction, unspecified erectile dysfunction type - sildenafil (VIAGRA) 100 MG tablet; Take 1 tablet (100 mg total) by mouth daily as needed for erectile dysfunction.  Dispense: 10 tablet; Refill: 6  5. Alcohol abuse - CMP14+EGFR  6. Encounter for immunization - loratadine (CLARITIN) 10 MG tablet; Take 1 tablet (10 mg total) by mouth daily.  Dispense: 30 tablet; Refill: 11 - gabapentin (NEURONTIN) 300 MG capsule; Take 1 capsule (300 mg total) by mouth 2 (two) times daily as needed. For nerve pain  Dispense: 60 capsule; Refill: 2 - HYDROcodone-acetaminophen (NORCO) 10-325 MG tablet; Take 1 tablet by mouth every 6 (six) hours as needed.  Dispense: 120 tablet; Refill: 0 - fluticasone furoate-vilanterol (BREO ELLIPTA) 200-25 MCG/INH AEPB; Inhale 1 puff into the lungs daily.  Dispense: 60 each; Refill: 2 - sildenafil (VIAGRA) 100 MG tablet; Take 1 tablet (100 mg total) by mouth daily as needed for erectile dysfunction.  Dispense: 10 tablet; Refill: 6 - HYDROcodone-acetaminophen (NORCO) 10-325 MG tablet; Take 1 tablet by mouth every 8 (eight) hours as needed.  Dispense: 120 tablet; Refill: 0 - HYDROcodone-acetaminophen (NORCO) 10-325 MG tablet; Take 1 tablet by mouth every 8 (eight) hours as needed.  Dispense: 120 tablet; Refill: 0 - PR BREATHING CAPACITY TEST - Flu Vaccine QUAD 36+ mos IM  7. Tobacco use disorder Strongly  encouraged cessation particularly in light of his severe pulmonary functions on today's tests. We'll plan to recheck in 3 months to recheck on all of his medicines. And we can repeat spirometry at that time.   Continue all other maintenance medications as listed above. Educational handout given for alcohol nutrition.  Follow up plan: Return in about 3 months (around 05/10/2016), or med recheck.  Terald Sleeper PA-C [facility] [address]  [place], [place] [address] [phone]   02/09/2016, 11:47 AM

## 2016-02-10 LAB — CMP14+EGFR
ALBUMIN: 4.5 g/dL (ref 3.5–5.5)
ALT: 115 IU/L — ABNORMAL HIGH (ref 0–44)
AST: 147 IU/L — ABNORMAL HIGH (ref 0–40)
Albumin/Globulin Ratio: 1.6 (ref 1.2–2.2)
Alkaline Phosphatase: 44 IU/L (ref 39–117)
BUN / CREAT RATIO: 7 — AB (ref 9–20)
BUN: 5 mg/dL — AB (ref 6–24)
Bilirubin Total: 0.4 mg/dL (ref 0.0–1.2)
CALCIUM: 8.9 mg/dL (ref 8.7–10.2)
CO2: 25 mmol/L (ref 18–29)
CREATININE: 0.69 mg/dL — AB (ref 0.76–1.27)
Chloride: 95 mmol/L — ABNORMAL LOW (ref 96–106)
GFR calc Af Amer: 130 mL/min/{1.73_m2} (ref >59)
GFR, EST NON AFRICAN AMERICAN: 112 mL/min/{1.73_m2} (ref >59)
GLOBULIN, TOTAL: 2.8 g/dL (ref 1.5–4.5)
GLUCOSE: 89 mg/dL (ref 65–99)
Potassium: 4.5 mmol/L (ref 3.5–5.2)
SODIUM: 135 mmol/L (ref 134–144)
TOTAL PROTEIN: 7.3 g/dL (ref 6.0–8.5)

## 2016-02-10 LAB — LIPID PANEL
CHOL/HDL RATIO: 2.9 ratio (ref 0.0–5.0)
CHOLESTEROL TOTAL: 178 mg/dL (ref 100–199)
HDL: 61 mg/dL (ref >39)
LDL CALC: 94 mg/dL (ref 0–99)
Triglycerides: 115 mg/dL (ref 0–149)
VLDL CHOLESTEROL CAL: 23 mg/dL (ref 5–40)

## 2016-02-15 ENCOUNTER — Telehealth: Payer: Self-pay | Admitting: Physician Assistant

## 2016-02-15 DIAGNOSIS — N529 Male erectile dysfunction, unspecified: Secondary | ICD-10-CM

## 2016-02-15 MED ORDER — SILDENAFIL CITRATE 20 MG PO TABS: 20.0000 mg | ORAL_TABLET | ORAL | 2 refills | Status: DC

## 2016-02-15 NOTE — Telephone Encounter (Signed)
Completed, sent rx

## 2016-05-10 ENCOUNTER — Ambulatory Visit: Payer: 59 | Admitting: Physician Assistant

## 2016-05-11 ENCOUNTER — Encounter: Payer: Self-pay | Admitting: Physician Assistant

## 2016-05-11 ENCOUNTER — Telehealth: Payer: Self-pay | Admitting: Physician Assistant

## 2016-05-24 ENCOUNTER — Telehealth: Payer: Self-pay | Admitting: Physician Assistant

## 2016-05-24 DIAGNOSIS — M1990 Unspecified osteoarthritis, unspecified site: Secondary | ICD-10-CM

## 2016-05-24 DIAGNOSIS — M5136 Other intervertebral disc degeneration, lumbar region: Secondary | ICD-10-CM

## 2016-05-24 MED ORDER — HYDROCODONE-ACETAMINOPHEN 10-325 MG PO TABS: 1.0000 | ORAL_TABLET | Freq: Three times a day (TID) | ORAL | 0 refills | Status: DC | PRN

## 2016-05-24 MED ORDER — HYDROCODONE-ACETAMINOPHEN 10-325 MG PO TABS: 1.0000 | ORAL_TABLET | Freq: Four times a day (QID) | ORAL | 0 refills | Status: DC | PRN

## 2016-05-24 NOTE — Telephone Encounter (Signed)
Scripts prepared for pick up

## 2016-05-25 NOTE — Telephone Encounter (Signed)
Several attempts have been made to contact patient.  

## 2016-08-21 ENCOUNTER — Other Ambulatory Visit: Payer: Self-pay | Admitting: Physician Assistant

## 2016-08-21 DIAGNOSIS — M5136 Other intervertebral disc degeneration, lumbar region: Secondary | ICD-10-CM

## 2016-08-21 DIAGNOSIS — M1990 Unspecified osteoarthritis, unspecified site: Secondary | ICD-10-CM

## 2016-08-29 ENCOUNTER — Encounter: Payer: Self-pay | Admitting: Physician Assistant

## 2016-08-29 ENCOUNTER — Ambulatory Visit (INDEPENDENT_AMBULATORY_CARE_PROVIDER_SITE_OTHER): Payer: 59 | Admitting: Physician Assistant

## 2016-08-29 VITALS — BP 142/90 | HR 92 | Temp 97.4°F | Ht 72.0 in | Wt 160.2 lb

## 2016-08-29 DIAGNOSIS — J411 Mucopurulent chronic bronchitis: Secondary | ICD-10-CM

## 2016-08-29 DIAGNOSIS — F1010 Alcohol abuse, uncomplicated: Secondary | ICD-10-CM

## 2016-08-29 DIAGNOSIS — M5136 Other intervertebral disc degeneration, lumbar region: Secondary | ICD-10-CM

## 2016-08-29 DIAGNOSIS — J410 Simple chronic bronchitis: Secondary | ICD-10-CM

## 2016-08-29 DIAGNOSIS — K7689 Other specified diseases of liver: Secondary | ICD-10-CM

## 2016-08-29 DIAGNOSIS — M51369 Other intervertebral disc degeneration, lumbar region without mention of lumbar back pain or lower extremity pain: Secondary | ICD-10-CM

## 2016-08-29 DIAGNOSIS — R945 Abnormal results of liver function studies: Secondary | ICD-10-CM

## 2016-08-29 DIAGNOSIS — M1990 Unspecified osteoarthritis, unspecified site: Secondary | ICD-10-CM | POA: Diagnosis not present

## 2016-08-29 DIAGNOSIS — G629 Polyneuropathy, unspecified: Secondary | ICD-10-CM

## 2016-08-29 MED ORDER — HYDROCODONE-ACETAMINOPHEN 10-325 MG PO TABS: 1.0000 | ORAL_TABLET | Freq: Three times a day (TID) | ORAL | 0 refills | Status: DC | PRN

## 2016-08-29 MED ORDER — HYDROCODONE-ACETAMINOPHEN 10-325 MG PO TABS: 1.0000 | ORAL_TABLET | Freq: Four times a day (QID) | ORAL | 0 refills | Status: DC | PRN

## 2016-08-29 MED ORDER — GABAPENTIN 300 MG PO CAPS: 300.0000 mg | ORAL_CAPSULE | Freq: Two times a day (BID) | ORAL | 5 refills | Status: DC | PRN

## 2016-08-29 MED ORDER — FLUTICASONE FUROATE-VILANTEROL 200-25 MCG/INH IN AEPB: 1.0000 | INHALATION_SPRAY | Freq: Every day | RESPIRATORY_TRACT | 5 refills | Status: DC

## 2016-08-29 NOTE — Patient Instructions (Signed)

## 2016-08-29 NOTE — Progress Notes (Signed)
BP (!) 142/90   Pulse 92   Temp 97.4 F (36.3 C) (Oral)   Ht 6' (1.829 m)   Wt 160 lb 3.2 oz (72.7 kg)   BMI 21.73 kg/m    Subjective:    Patient ID: Mitchell May, male    DOB: [birth year], 51 y.o.   MRN: [id number]  HPI: Mitchell May is a 51 y.o. male presenting on 08/29/2016 for Follow-up  This patient comes in for periodic recheck on medications and conditions including COPD, DDD, chronic back pain, neuropathy.  Had stopped meds due to commercials he had seen on tv, long discussion about the side effects, which he is having none.  Will be starting back on his medications. All medications are reviewed today. There are no reports of any problems with the medications. All of the medical conditions are reviewed and updated.  Lab work is reviewed and will be ordered as medically necessary. There are no new problems reported with today's visit.   Relevant past medical, surgical, family and social history reviewed and updated as indicated. Allergies and medications reviewed and updated.  Past Medical History:  Diagnosis Date  . GERD (gastroesophageal reflux disease)   . Heart murmur   . Seizures (HCC)    one >10 yrs ? reason    Past Surgical History:  Procedure Laterality Date  . CERVICAL LAMINECTOMY      Review of Systems  Constitutional: Negative.  Negative for appetite change and fatigue.  HENT: Negative.   Eyes: Negative.  Negative for pain and visual disturbance.  Respiratory: Negative.  Negative for cough, chest tightness, shortness of breath and wheezing.   Cardiovascular: Negative.  Negative for chest pain, palpitations and leg swelling.  Gastrointestinal: Negative.  Negative for abdominal pain, diarrhea, nausea and vomiting.  Endocrine: Negative.   Genitourinary: Negative.   Musculoskeletal: Positive for arthralgias, back pain, gait problem and myalgias. Negative for joint swelling.  Skin: Negative.  Negative for color change and rash.  Neurological:  Negative for weakness, numbness and headaches.  Psychiatric/Behavioral: Negative.     Allergies as of 08/29/2016      Reactions   Penicillins Other (See Comments)   Childhood reaction      Medication List       Accurate as of 08/29/16  9:04 AM. Always use your most recent med list.          fluticasone furoate-vilanterol 200-25 MCG/INH Aepb Commonly known as:  BREO ELLIPTA Inhale 1 puff into the lungs daily.   gabapentin 300 MG capsule Commonly known as:  NEURONTIN Take 1 capsule (300 mg total) by mouth 2 (two) times daily as needed. For nerve pain   HYDROcodone-acetaminophen 10-325 MG tablet Commonly known as:  NORCO Take 1 tablet by mouth every 6 (six) hours as needed.   HYDROcodone-acetaminophen 10-325 MG tablet Commonly known as:  NORCO Take 1 tablet by mouth every 8 (eight) hours as needed.   HYDROcodone-acetaminophen 10-325 MG tablet Commonly known as:  NORCO Take 1 tablet by mouth every 8 (eight) hours as needed.   loratadine 10 MG tablet Commonly known as:  CLARITIN Take 1 tablet (10 mg total) by mouth daily.   sildenafil 20 MG tablet Commonly known as:  REVATIO Take 1-4 tablets (20-80 mg total) by mouth as directed. Take 30 minutes before intercourse          Objective:    BP (!) 142/90   Pulse 92   Temp 97.4 F (36.3 C) (Oral)  Ht 6' (1.829 m)   Wt 160 lb 3.2 oz (72.7 kg)   BMI 21.73 kg/m   Allergies  Allergen Reactions  . Penicillins Other (See Comments)    Childhood reaction    Physical Exam  Constitutional: He appears well-developed and well-nourished. No distress.  HENT:  Head: Normocephalic and atraumatic.  Eyes: Conjunctivae and EOM are normal. Pupils are equal, round, and reactive to light.  Cardiovascular: Normal rate, regular rhythm and normal heart sounds.   Pulmonary/Chest: Effort normal. No respiratory distress. He has wheezes.  Musculoskeletal:       Lumbar back: He exhibits decreased range of motion, tenderness, pain and  spasm.  Neurological:  Reflex Scores:      Patellar reflexes are 1+ on the right side and 1+ on the left side. Skin: Skin is warm and dry.  Psychiatric: He has a normal mood and affect. His behavior is normal.  Nursing note and vitals reviewed.       Assessment & Plan:   1. Mucopurulent chronic bronchitis (HCC) - fluticasone furoate-vilanterol (BREO ELLIPTA) 200-25 MCG/INH AEPB; Inhale 1 puff into the lungs daily.  Dispense: 60 each; Refill: 5  2. Simple chronic bronchitis (HCC) - fluticasone furoate-vilanterol (BREO ELLIPTA) 200-25 MCG/INH AEPB; Inhale 1 puff into the lungs daily.  Dispense: 60 each; Refill: 5  3. DDD (degenerative disc disease), lumbar - HYDROcodone-acetaminophen (NORCO) 10-325 MG tablet; Take 1 tablet by mouth every 6 (six) hours as needed.  Dispense: 120 tablet; Refill: 0 - HYDROcodone-acetaminophen (NORCO) 10-325 MG tablet; Take 1 tablet by mouth every 8 (eight) hours as needed.  Dispense: 120 tablet; Refill: 0 - HYDROcodone-acetaminophen (NORCO) 10-325 MG tablet; Take 1 tablet by mouth every 8 (eight) hours as needed.  Dispense: 120 tablet; Refill: 0 - gabapentin (NEURONTIN) 300 MG capsule; Take 1 capsule (300 mg total) by mouth 2 (two) times daily as needed. For nerve pain  Dispense: 60 capsule; Refill: 5  4. Arthritis - HYDROcodone-acetaminophen (NORCO) 10-325 MG tablet; Take 1 tablet by mouth every 6 (six) hours as needed.  Dispense: 120 tablet; Refill: 0 - HYDROcodone-acetaminophen (NORCO) 10-325 MG tablet; Take 1 tablet by mouth every 8 (eight) hours as needed.  Dispense: 120 tablet; Refill: 0 - HYDROcodone-acetaminophen (NORCO) 10-325 MG tablet; Take 1 tablet by mouth every 8 (eight) hours as needed.  Dispense: 120 tablet; Refill: 0  5. Neuropathy (HCC) - gabapentin (NEURONTIN) 300 MG capsule; Take 1 capsule (300 mg total) by mouth 2 (two) times daily as needed. For nerve pain  Dispense: 60 capsule; Refill: 5   Continue all other maintenance medications  as listed above.  Follow up plan: Return in about 3 months (around 11/28/2016) for recheck.  Educational handout given for COPD  Remus Loffler PA-C [facility] [address]  [place], [place] [address] [phone]   08/29/2016, 9:04 AM

## 2016-08-30 LAB — HEPATIC FUNCTION PANEL
ALK PHOS: 48 IU/L (ref 39–117)
ALT: 37 IU/L (ref 0–44)
AST: 45 IU/L — ABNORMAL HIGH (ref 0–40)
Albumin: 4.3 g/dL (ref 3.5–5.5)
Bilirubin Total: 0.2 mg/dL (ref 0.0–1.2)
Bilirubin, Direct: 0.09 mg/dL (ref 0.00–0.40)
TOTAL PROTEIN: 7.3 g/dL (ref 6.0–8.5)

## 2016-08-30 LAB — CBC WITH DIFFERENTIAL/PLATELET
BASOS ABS: 0.1 10*3/uL (ref 0.0–0.2)
Basos: 1 %
EOS (ABSOLUTE): 0.3 10*3/uL (ref 0.0–0.4)
Eos: 3 %
Hematocrit: 40.1 % (ref 37.5–51.0)
Hemoglobin: 13.8 g/dL (ref 13.0–17.7)
Immature Grans (Abs): 0 10*3/uL (ref 0.0–0.1)
Immature Granulocytes: 0 %
Lymphocytes Absolute: 2.8 10*3/uL (ref 0.7–3.1)
Lymphs: 32 %
MCH: 31.7 pg (ref 26.6–33.0)
MCHC: 34.4 g/dL (ref 31.5–35.7)
MCV: 92 fL (ref 79–97)
MONOS ABS: 1.3 10*3/uL — AB (ref 0.1–0.9)
Monocytes: 14 %
NEUTROS ABS: 4.5 10*3/uL (ref 1.4–7.0)
Neutrophils: 50 %
PLATELETS: 209 10*3/uL (ref 150–379)
RBC: 4.36 x10E6/uL (ref 4.14–5.80)
RDW: 13.9 % (ref 12.3–15.4)
WBC: 9 10*3/uL (ref 3.4–10.8)

## 2016-09-04 ENCOUNTER — Encounter: Payer: Self-pay | Admitting: Physician Assistant

## 2016-09-04 ENCOUNTER — Ambulatory Visit: Payer: 59 | Admitting: Physician Assistant

## 2016-09-04 ENCOUNTER — Ambulatory Visit (INDEPENDENT_AMBULATORY_CARE_PROVIDER_SITE_OTHER): Payer: 59 | Admitting: Physician Assistant

## 2016-09-04 VITALS — BP 133/87 | HR 103 | Temp 98.8°F | Ht 72.0 in | Wt 161.0 lb

## 2016-09-04 DIAGNOSIS — Z0000 Encounter for general adult medical examination without abnormal findings: Secondary | ICD-10-CM

## 2016-09-04 NOTE — Patient Instructions (Signed)
 Health Maintenance, Male A healthy lifestyle and preventive care is important for your health and wellness. Ask your health care provider about what schedule of regular examinations is right for you. What should I know about weight and diet?  Eat a Healthy Diet  Eat plenty of vegetables, fruits, whole grains, low-fat dairy products, and lean protein.  Do not eat a lot of foods high in solid fats, added sugars, or salt. Maintain a Healthy Weight  Regular exercise can help you achieve or maintain a healthy weight. You should:  Do at least 150 minutes of exercise each week. The exercise should increase your heart rate and make you sweat (moderate-intensity exercise).  Do strength-training exercises at least twice a week. Watch Your Levels of Cholesterol and Blood Lipids  Have your blood tested for lipids and cholesterol every 5 years starting at 49 years of age. If you are at high risk for heart disease, you should start having your blood tested when you are 49 years old. You may need to have your cholesterol levels checked more often if:  Your lipid or cholesterol levels are high.  You are older than 50 years of age.  You are at high risk for heart disease. What should I know about cancer screening? Many types of cancers can be detected early and may often be prevented. Lung Cancer  You should be screened every year for lung cancer if:  You are a current smoker who has smoked for at least 30 years.  You are a former smoker who has quit within the past 15 years.  Talk to your health care provider about your screening options, when you should start screening, and how often you should be screened. Colorectal Cancer  Routine colorectal cancer screening usually begins at 50 years of age and should be repeated every 5-10 years until you are 49 years old. You may need to be screened more often if early forms of precancerous polyps or small growths are found. Your health care provider  may recommend screening at an earlier age if you have risk factors for colon cancer.  Your health care provider may recommend using home test kits to check for hidden blood in the stool.  A small camera at the end of a tube can be used to examine your colon (sigmoidoscopy or colonoscopy). This checks for the earliest forms of colorectal cancer. Prostate and Testicular Cancer  Depending on your age and overall health, your health care provider may do certain tests to screen for prostate and testicular cancer.  Talk to your health care provider about any symptoms or concerns you have about testicular or prostate cancer. Skin Cancer  Check your skin from head to toe regularly.  Tell your health care provider about any new moles or changes in moles, especially if:  There is a change in a mole's size, shape, or color.  You have a mole that is larger than a pencil eraser.  Always use sunscreen. Apply sunscreen liberally and repeat throughout the day.  Protect yourself by wearing long sleeves, pants, a wide-brimmed hat, and sunglasses when outside. What should I know about heart disease, diabetes, and high blood pressure?  If you are 18-39 years of age, have your blood pressure checked every 3-5 years. If you are 40 years of age or older, have your blood pressure checked every year. You should have your blood pressure measured twice-once when you are at a hospital or clinic, and once when you are not at   a hospital or clinic. Record the average of the two measurements. To check your blood pressure when you are not at a hospital or clinic, you can use:  An automated blood pressure machine at a pharmacy.  A home blood pressure monitor.  Talk to your health care provider about your target blood pressure.  If you are between 45-79 years old, ask your health care provider if you should take aspirin to prevent heart disease.  Have regular diabetes screenings by checking your fasting blood sugar  level.  If you are at a normal weight and have a low risk for diabetes, have this test once every three years after the age of 45.  If you are overweight and have a high risk for diabetes, consider being tested at a younger age or more often.  A one-time screening for abdominal aortic aneurysm (AAA) by ultrasound is recommended for men aged 65-75 years who are current or former smokers. What should I know about preventing infection? Hepatitis B  If you have a higher risk for hepatitis B, you should be screened for this virus. Talk with your health care provider to find out if you are at risk for hepatitis B infection. Hepatitis C  Blood testing is recommended for:  Everyone born from 1945 through 1965.  Anyone with known risk factors for hepatitis C. Sexually Transmitted Diseases (STDs)  You should be screened each year for STDs including gonorrhea and chlamydia if:  You are sexually active and are younger than 49 years of age.  You are older than 49 years of age and your health care provider tells you that you are at risk for this type of infection.  Your sexual activity has changed since you were last screened and you are at an increased risk for chlamydia or gonorrhea. Ask your health care provider if you are at risk.  Talk with your health care provider about whether you are at high risk of being infected with HIV. Your health care provider may recommend a prescription medicine to help prevent HIV infection. What else can I do?  Schedule regular health, dental, and eye exams.  Stay current with your vaccines (immunizations).  Do not use any tobacco products, such as cigarettes, chewing tobacco, and e-cigarettes. If you need help quitting, ask your health care provider.  Limit alcohol intake to no more than 2 drinks per day. One drink equals 12 ounces of beer, 5 ounces of wine, or 1 ounces of hard liquor.  Do not use street drugs.  Do not share needles.  Ask your health  care provider for help if you need support or information about quitting drugs.  Tell your health care provider if you often feel depressed.  Tell your health care provider if you have ever been abused or do not feel safe at home. This information is not intended to replace advice given to you by your health care provider. Make sure you discuss any questions you have with your health care provider. Document Released: 11/04/2007 Document Revised: 01/05/2016 Document Reviewed: 02/09/2015 Elsevier Interactive Patient Education  2017 [employer]vier Inc.  

## 2016-09-04 NOTE — Progress Notes (Signed)
BP 133/87   Pulse (!) 103   Temp 98.8 F (37.1 C) (Oral)   Ht 6' (1.829 m)   Wt 161 lb (73 kg)   BMI 21.84 kg/m    Subjective:    Patient ID: Mitchell May, male    DOB: 1966-01-29, 51 y.o.   MRN: [id number]  HPI: Mitchell May is a 51 y.o. male presenting on 09/04/2016 for Annual Exam  This patient comes in for annual well physical examination. All medications are reviewed today. There are no reports of any problems with the medications. All of the medical conditions are reviewed and updated.  Lab work is reviewed and will be ordered as medically necessary. There are no new problems reported with today's visit.  Patient reports doing well overall. Patient was able to reduce insurance premium if he has this done once here. All his medications are reviewed today. We will have well labs drawn today.  Relevant past medical, surgical, family and social history reviewed and updated as indicated. Allergies and medications reviewed and updated.  Past Medical History:  Diagnosis Date  . GERD (gastroesophageal reflux disease)   . Heart murmur   . Seizures ([place])    one >10 yrs ? reason    Past Surgical History:  Procedure Laterality Date  . CERVICAL LAMINECTOMY      Review of Systems  Constitutional: Positive for fatigue. Negative for appetite change and fever.  HENT: Negative.   Eyes: Negative.  Negative for pain and visual disturbance.  Respiratory: Positive for cough and shortness of breath. Negative for chest tightness and wheezing.   Cardiovascular: Negative.  Negative for chest pain, palpitations and leg swelling.  Gastrointestinal: Negative.  Negative for abdominal pain, diarrhea, nausea and vomiting.  Endocrine: Negative.   Genitourinary: Negative.   Musculoskeletal: Positive for arthralgias, back pain, gait problem and myalgias.  Skin: Negative.  Negative for color change and rash.  Neurological: Negative for weakness, numbness and headaches.    Psychiatric/Behavioral: Negative.     Allergies as of 09/04/2016      Reactions   Penicillins Other (See Comments)   Childhood reaction      Medication List       Accurate as of 09/04/16  1:56 PM. Always use your most recent med list.          fluticasone furoate-vilanterol 200-25 MCG/INH Aepb Commonly known as:  BREO ELLIPTA Inhale 1 puff into the lungs daily.   gabapentin 300 MG capsule Commonly known as:  NEURONTIN Take 1 capsule (300 mg total) by mouth 2 (two) times daily as needed. For nerve pain   HYDROcodone-acetaminophen 10-325 MG tablet Commonly known as:  NORCO Take 1 tablet by mouth every 6 (six) hours as needed.   HYDROcodone-acetaminophen 10-325 MG tablet Commonly known as:  NORCO Take 1 tablet by mouth every 8 (eight) hours as needed.   HYDROcodone-acetaminophen 10-325 MG tablet Commonly known as:  NORCO Take 1 tablet by mouth every 8 (eight) hours as needed.   loratadine 10 MG tablet Commonly known as:  CLARITIN Take 1 tablet (10 mg total) by mouth daily.   sildenafil 20 MG tablet Commonly known as:  REVATIO Take 1-4 tablets (20-80 mg total) by mouth as directed. Take 30 minutes before intercourse          Objective:    BP 133/87   Pulse (!) 103   Temp 98.8 F (37.1 C) (Oral)   Ht 6' (1.829 m)   Wt 161 lb (73  kg)   BMI 21.84 kg/m   Allergies  Allergen Reactions  . Penicillins Other (See Comments)    Childhood reaction    Physical Exam  Constitutional: He appears well-developed and well-nourished.  HENT:  Head: Normocephalic and atraumatic.  Eyes: Conjunctivae and EOM are normal. Pupils are equal, round, and reactive to light.  Neck: Normal range of motion. Neck supple.  Cardiovascular: Normal rate, regular rhythm and normal heart sounds.   Pulmonary/Chest: Effort normal and breath sounds normal.  Abdominal: Soft. Bowel sounds are normal.  Musculoskeletal:       Cervical back: He exhibits decreased range of motion, tenderness,  pain and spasm. He exhibits no edema and no deformity.  Skin: Skin is warm and dry.  Nursing note and vitals reviewed.   Results for orders placed or performed in visit on 08/29/16  CBC with Differential/Platelet  Result Value Ref Range   WBC 9.0 3.4 - 10.8 x10E3/uL   RBC 4.36 4.14 - 5.80 x10E6/uL   Hemoglobin 13.8 13.0 - 17.7 g/dL   Hematocrit 40.1 37.5 - 51.0 %   MCV 92 79 - 97 fL   MCH 31.7 26.6 - 33.0 pg   MCHC 34.4 31.5 - 35.7 g/dL   RDW 13.9 12.3 - 15.4 %   Platelets 209 150 - 379 x10E3/uL   Neutrophils 50 Not Estab. %   Lymphs 32 Not Estab. %   Monocytes 14 Not Estab. %   Eos 3 Not Estab. %   Basos 1 Not Estab. %   Neutrophils Absolute 4.5 1.4 - 7.0 x10E3/uL   Lymphocytes Absolute 2.8 0.7 - 3.1 x10E3/uL   Monocytes Absolute 1.3 (H) 0.1 - 0.9 x10E3/uL   EOS (ABSOLUTE) 0.3 0.0 - 0.4 x10E3/uL   Basophils Absolute 0.1 0.0 - 0.2 x10E3/uL   Immature Granulocytes 0 Not Estab. %   Immature Grans (Abs) 0.0 0.0 - 0.1 x10E3/uL  Hepatic function panel  Result Value Ref Range   Total Protein 7.3 6.0 - 8.5 g/dL   Albumin 4.3 3.5 - 5.5 g/dL   Bilirubin Total 0.2 0.0 - 1.2 mg/dL   Bilirubin, Direct 0.09 0.00 - 0.40 mg/dL   Alkaline Phosphatase 48 39 - 117 IU/L   AST 45 (H) 0 - 40 IU/L   ALT 37 0 - 44 IU/L      Assessment & Plan:   1. Well adult exam - CMP14+EGFR - Lipid panel - CBC with Differential/Platelet   Current Outpatient Prescriptions:  .  fluticasone furoate-vilanterol (BREO ELLIPTA) 200-25 MCG/INH AEPB, Inhale 1 puff into the lungs daily., Disp: 60 each, Rfl: 5 .  gabapentin (NEURONTIN) 300 MG capsule, Take 1 capsule (300 mg total) by mouth 2 (two) times daily as needed. For nerve pain, Disp: 60 capsule, Rfl: 5 .  HYDROcodone-acetaminophen (NORCO) 10-325 MG tablet, Take 1 tablet by mouth every 6 (six) hours as needed., Disp: 120 tablet, Rfl: 0 .  HYDROcodone-acetaminophen (NORCO) 10-325 MG tablet, Take 1 tablet by mouth every 8 (eight) hours as needed., Disp: 120  tablet, Rfl: 0 .  HYDROcodone-acetaminophen (NORCO) 10-325 MG tablet, Take 1 tablet by mouth every 8 (eight) hours as needed., Disp: 120 tablet, Rfl: 0 .  loratadine (CLARITIN) 10 MG tablet, Take 1 tablet (10 mg total) by mouth daily., Disp: 30 tablet, Rfl: 11 .  sildenafil (REVATIO) 20 MG tablet, Take 1-4 tablets (20-80 mg total) by mouth as directed. Take 30 minutes before intercourse, Disp: 30 tablet, Rfl: 2  Continue all other maintenance medications as listed above.  Follow up plan: Return in about 1 year (around 09/04/2017) for annual check.  Educational handout given for health maintenance  Terald Sleeper PA-C [facility] [address]  [place], [place] [address] [phone]   09/04/2016, 1:56 PM

## 2016-09-05 LAB — CMP14+EGFR
A/G RATIO: 1.5 (ref 1.2–2.2)
ALT: 28 IU/L (ref 0–44)
AST: 38 IU/L (ref 0–40)
Albumin: 4.5 g/dL (ref 3.5–5.5)
Alkaline Phosphatase: 55 IU/L (ref 39–117)
BILIRUBIN TOTAL: 0.3 mg/dL (ref 0.0–1.2)
BUN/Creatinine Ratio: 7 — ABNORMAL LOW (ref 9–20)
BUN: 6 mg/dL (ref 6–24)
CO2: 21 mmol/L (ref 18–29)
Calcium: 8.9 mg/dL (ref 8.7–10.2)
Chloride: 94 mmol/L — ABNORMAL LOW (ref 96–106)
Creatinine, Ser: 0.82 mg/dL (ref 0.76–1.27)
GFR calc Af Amer: 121 mL/min/{1.73_m2} (ref >59)
GFR calc non Af Amer: 105 mL/min/{1.73_m2} (ref >59)
Globulin, Total: 3.1 g/dL (ref 1.5–4.5)
Glucose: 82 mg/dL (ref 65–99)
POTASSIUM: 5 mmol/L (ref 3.5–5.2)
SODIUM: 137 mmol/L (ref 134–144)
Total Protein: 7.6 g/dL (ref 6.0–8.5)

## 2016-09-05 LAB — CBC WITH DIFFERENTIAL/PLATELET
BASOS ABS: 0.1 10*3/uL (ref 0.0–0.2)
Basos: 2 %
EOS (ABSOLUTE): 0.5 10*3/uL — AB (ref 0.0–0.4)
Eos: 7 %
HEMOGLOBIN: 14.6 g/dL (ref 13.0–17.7)
Hematocrit: 42.6 % (ref 37.5–51.0)
IMMATURE GRANS (ABS): 0.1 10*3/uL (ref 0.0–0.1)
IMMATURE GRANULOCYTES: 1 %
LYMPHS: 31 %
Lymphocytes Absolute: 2.4 10*3/uL (ref 0.7–3.1)
MCH: 31.9 pg (ref 26.6–33.0)
MCHC: 34.3 g/dL (ref 31.5–35.7)
MCV: 93 fL (ref 79–97)
MONOCYTES: 12 %
Monocytes Absolute: 0.9 10*3/uL (ref 0.1–0.9)
NEUTROS PCT: 47 %
Neutrophils Absolute: 3.8 10*3/uL (ref 1.4–7.0)
PLATELETS: 262 10*3/uL (ref 150–379)
RBC: 4.58 x10E6/uL (ref 4.14–5.80)
RDW: 14.1 % (ref 12.3–15.4)
WBC: 7.7 10*3/uL (ref 3.4–10.8)

## 2016-09-05 LAB — LIPID PANEL
CHOL/HDL RATIO: 2.6 ratio (ref 0.0–5.0)
Cholesterol, Total: 190 mg/dL (ref 100–199)
HDL: 72 mg/dL (ref >39)
LDL Calculated: 104 mg/dL — ABNORMAL HIGH (ref 0–99)
TRIGLYCERIDES: 72 mg/dL (ref 0–149)
VLDL Cholesterol Cal: 14 mg/dL (ref 5–40)

## 2016-11-29 ENCOUNTER — Other Ambulatory Visit: Payer: Self-pay | Admitting: Physician Assistant

## 2016-11-29 ENCOUNTER — Telehealth: Payer: Self-pay | Admitting: Physician Assistant

## 2016-11-29 DIAGNOSIS — M1990 Unspecified osteoarthritis, unspecified site: Secondary | ICD-10-CM

## 2016-11-29 DIAGNOSIS — M5136 Other intervertebral disc degeneration, lumbar region: Secondary | ICD-10-CM

## 2016-11-29 NOTE — Telephone Encounter (Signed)
He needs to be seen? Correct?

## 2016-11-29 NOTE — Telephone Encounter (Signed)
Looks like it is time to be seen, has to be every 3 months.

## 2016-11-29 NOTE — Telephone Encounter (Signed)
Pt's notified [facility] appt scheduled

## 2016-11-30 ENCOUNTER — Encounter: Payer: Self-pay | Admitting: Physician Assistant

## 2016-11-30 ENCOUNTER — Ambulatory Visit (INDEPENDENT_AMBULATORY_CARE_PROVIDER_SITE_OTHER): Payer: 59 | Admitting: Physician Assistant

## 2016-11-30 DIAGNOSIS — M1990 Unspecified osteoarthritis, unspecified site: Secondary | ICD-10-CM | POA: Diagnosis not present

## 2016-11-30 DIAGNOSIS — J301 Allergic rhinitis due to pollen: Secondary | ICD-10-CM | POA: Diagnosis not present

## 2016-11-30 DIAGNOSIS — M5136 Other intervertebral disc degeneration, lumbar region: Secondary | ICD-10-CM

## 2016-11-30 MED ORDER — HYDROCODONE-ACETAMINOPHEN 10-325 MG PO TABS: 1.0000 | ORAL_TABLET | Freq: Three times a day (TID) | ORAL | 0 refills | Status: DC | PRN

## 2016-11-30 MED ORDER — HYDROCODONE-ACETAMINOPHEN 10-325 MG PO TABS: 1.0000 | ORAL_TABLET | Freq: Four times a day (QID) | ORAL | 0 refills | Status: DC | PRN

## 2016-11-30 MED ORDER — LORATADINE 10 MG PO TABS: 10.0000 mg | ORAL_TABLET | Freq: Every day | ORAL | 11 refills | Status: DC

## 2016-11-30 NOTE — Progress Notes (Signed)
BP (!) 154/104   Pulse 80   Temp (!) 97.5 F (36.4 C) (Oral)   Ht 6' (1.829 m)   Wt 156 lb 6.4 oz (70.9 kg)   BMI 21.21 kg/m    Subjective:    Patient ID: Mitchell May, male    DOB: 1966/01/29, 51 y.o.   MRN: [id number]  HPI: Mitchell May is a 51 y.o. 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 Reports overall he is doing well with his medication. He has been out of 3 of this past month because he couldn't afford the co-pay. We have given him a car that should reduce his computed $10 per month. Have him come back in 3 months. No other issues at this time.  All medications are reviewed today. There are no reports of any problems with the medications. All of the medical conditions are reviewed and updated.  Lab work is reviewed and will be ordered as medically necessary. There are no new problems reported with today's visit.   Relevant past medical, surgical, family and social history reviewed and updated as indicated. Allergies and medications reviewed and updated.  Past Medical History:  Diagnosis Date  . GERD (gastroesophageal reflux disease)   . Heart murmur   . Seizures ([place])    one >10 yrs ? reason    Past Surgical History:  Procedure Laterality Date  . CERVICAL LAMINECTOMY      Review of Systems  Constitutional: Negative.  Negative for appetite change and fatigue.  HENT: Negative.   Eyes: Negative.  Negative for pain and visual disturbance.  Respiratory: Positive for shortness of breath. Negative for cough, chest tightness and wheezing.   Cardiovascular: Negative.  Negative for chest pain, palpitations and leg swelling.  Gastrointestinal: Negative.  Negative for abdominal pain, diarrhea, nausea and vomiting.  Endocrine: Negative.     Genitourinary: Negative.   Musculoskeletal: Negative.   Skin: Negative.  Negative for color change and rash.  Neurological: Negative.  Negative for weakness, numbness and headaches.  Psychiatric/Behavioral: Negative.     Allergies as of 11/30/2016      Reactions   Penicillins Other (See Comments)   Childhood reaction      Medication List       Accurate as of 11/30/16 11:46 AM. Always use your most recent med list.          fluticasone furoate-vilanterol 200-25 MCG/INH Aepb Commonly known as:  BREO ELLIPTA Inhale 1 puff into the lungs daily.   gabapentin 300 MG capsule Commonly known as:  NEURONTIN Take 1 capsule (300 mg total) by mouth 2 (two) times daily as needed. For nerve pain   HYDROcodone-acetaminophen 10-325 MG tablet Commonly known as:  NORCO Take 1 tablet by mouth every 6 (six) hours as needed.   HYDROcodone-acetaminophen 10-325 MG tablet Commonly known as:  NORCO Take 1 tablet by mouth every 8 (eight) hours as needed.   HYDROcodone-acetaminophen 10-325 MG tablet Commonly known as:  NORCO Take 1 tablet by mouth every 8 (eight) hours as needed.   loratadine 10 MG tablet Commonly known as:  CLARITIN Take 1 tablet (10 mg total) by mouth daily.   sildenafil 20 MG tablet Commonly known as:  REVATIO Take 1-4 tablets (20-80 mg total) by mouth as directed. Take 30 minutes before intercourse  Objective:    BP (!) 154/104   Pulse 80   Temp (!) 97.5 F (36.4 C) (Oral)   Ht 6' (1.829 m)   Wt 156 lb 6.4 oz (70.9 kg)   BMI 21.21 kg/m   Allergies  Allergen Reactions  . Penicillins Other (See Comments)    Childhood reaction    Physical Exam  Constitutional: He appears well-developed and well-nourished.  HENT:  Head: Normocephalic and atraumatic.  Eyes: Pupils are equal, round, and reactive to light. Conjunctivae and EOM are normal.  Neck: Normal range of motion. Neck supple.  Cardiovascular: Normal rate, regular rhythm and normal heart  sounds.   Pulmonary/Chest: Effort normal. He has wheezes.  Abdominal: Soft. Bowel sounds are normal.  Musculoskeletal: Normal range of motion.  Skin: Skin is warm and dry.    Results for orders placed or performed in visit on 09/04/16  CMP14+EGFR  Result Value Ref Range   Glucose 82 65 - 99 mg/dL   BUN 6 6 - 24 mg/dL   Creatinine, Ser 0.82 0.76 - 1.27 mg/dL   GFR calc non Af Amer 105 >59 mL/min/1.73   GFR calc Af Amer 121 >59 mL/min/1.73   BUN/Creatinine Ratio 7 (L) 9 - 20   Sodium 137 134 - 144 mmol/L   Potassium 5.0 3.5 - 5.2 mmol/L   Chloride 94 (L) 96 - 106 mmol/L   CO2 21 18 - 29 mmol/L   Calcium 8.9 8.7 - 10.2 mg/dL   Total Protein 7.6 6.0 - 8.5 g/dL   Albumin 4.5 3.5 - 5.5 g/dL   Globulin, Total 3.1 1.5 - 4.5 g/dL   Albumin/Globulin Ratio 1.5 1.2 - 2.2   Bilirubin Total 0.3 0.0 - 1.2 mg/dL   Alkaline Phosphatase 55 39 - 117 IU/L   AST 38 0 - 40 IU/L   ALT 28 0 - 44 IU/L  Lipid panel  Result Value Ref Range   Cholesterol, Total 190 100 - 199 mg/dL   Triglycerides 72 0 - 149 mg/dL   HDL 72 >39 mg/dL   VLDL Cholesterol Cal 14 5 - 40 mg/dL   LDL Calculated 104 (H) 0 - 99 mg/dL   Chol/HDL Ratio 2.6 0.0 - 5.0 ratio  CBC with Differential/Platelet  Result Value Ref Range   WBC 7.7 3.4 - 10.8 x10E3/uL   RBC 4.58 4.14 - 5.80 x10E6/uL   Hemoglobin 14.6 13.0 - 17.7 g/dL   Hematocrit 42.6 37.5 - 51.0 %   MCV 93 79 - 97 fL   MCH 31.9 26.6 - 33.0 pg   MCHC 34.3 31.5 - 35.7 g/dL   RDW 14.1 12.3 - 15.4 %   Platelets 262 150 - 379 x10E3/uL   Neutrophils 47 Not Estab. %   Lymphs 31 Not Estab. %   Monocytes 12 Not Estab. %   Eos 7 Not Estab. %   Basos 2 Not Estab. %   Neutrophils Absolute 3.8 1.4 - 7.0 x10E3/uL   Lymphocytes Absolute 2.4 0.7 - 3.1 x10E3/uL   Monocytes Absolute 0.9 0.1 - 0.9 x10E3/uL   EOS (ABSOLUTE) 0.5 (H) 0.0 - 0.4 x10E3/uL   Basophils Absolute 0.1 0.0 - 0.2 x10E3/uL   Immature Granulocytes 1 Not Estab. %   Immature Grans (Abs) 0.1 0.0 - 0.1 x10E3/uL        Assessment & Plan:   1. DDD (degenerative disc disease), lumbar - HYDROcodone-acetaminophen (NORCO) 10-325 MG tablet; Take 1 tablet by mouth every 6 (six) hours as needed.  Dispense: 120 tablet; Refill:  0 - HYDROcodone-acetaminophen (NORCO) 10-325 MG tablet; Take 1 tablet by mouth every 8 (eight) hours as needed.  Dispense: 120 tablet; Refill: 0 - HYDROcodone-acetaminophen (NORCO) 10-325 MG tablet; Take 1 tablet by mouth every 8 (eight) hours as needed.  Dispense: 120 tablet; Refill: 0  2. Arthritis - HYDROcodone-acetaminophen (NORCO) 10-325 MG tablet; Take 1 tablet by mouth every 6 (six) hours as needed.  Dispense: 120 tablet; Refill: 0 - HYDROcodone-acetaminophen (NORCO) 10-325 MG tablet; Take 1 tablet by mouth every 8 (eight) hours as needed.  Dispense: 120 tablet; Refill: 0 - HYDROcodone-acetaminophen (NORCO) 10-325 MG tablet; Take 1 tablet by mouth every 8 (eight) hours as needed.  Dispense: 120 tablet; Refill: 0  3. Allergic rhinitis due to pollen, unspecified seasonality - loratadine (CLARITIN) 10 MG tablet; Take 1 tablet (10 mg total) by mouth daily.  Dispense: 30 tablet; Refill: 11   Continue all other maintenance medications as listed above.  Follow up plan: Return in about 3 months (around 03/02/2017) for recheck.  Educational handout given for [place] PA-C [facility] [address]  [place], [place] [address] [phone]   11/30/2016, 11:46 AM

## 2017-03-07 ENCOUNTER — Ambulatory Visit: Payer: 59 | Admitting: Physician Assistant

## 2017-03-07 ENCOUNTER — Encounter: Payer: Self-pay | Admitting: Physician Assistant

## 2017-03-19 ENCOUNTER — Other Ambulatory Visit: Payer: Self-pay | Admitting: Physician Assistant

## 2017-03-19 ENCOUNTER — Ambulatory Visit (INDEPENDENT_AMBULATORY_CARE_PROVIDER_SITE_OTHER): Payer: 59 | Admitting: Physician Assistant

## 2017-03-19 ENCOUNTER — Encounter: Payer: Self-pay | Admitting: Physician Assistant

## 2017-03-19 VITALS — BP 136/89 | HR 128 | Temp 98.1°F | Ht 72.0 in | Wt 152.0 lb

## 2017-03-19 DIAGNOSIS — Z23 Encounter for immunization: Secondary | ICD-10-CM | POA: Diagnosis not present

## 2017-03-19 DIAGNOSIS — J411 Mucopurulent chronic bronchitis: Secondary | ICD-10-CM | POA: Diagnosis not present

## 2017-03-19 DIAGNOSIS — M5136 Other intervertebral disc degeneration, lumbar region: Secondary | ICD-10-CM | POA: Diagnosis not present

## 2017-03-19 DIAGNOSIS — N529 Male erectile dysfunction, unspecified: Secondary | ICD-10-CM

## 2017-03-19 DIAGNOSIS — M1990 Unspecified osteoarthritis, unspecified site: Secondary | ICD-10-CM | POA: Diagnosis not present

## 2017-03-19 MED ORDER — HYDROCODONE-ACETAMINOPHEN 10-325 MG PO TABS: 1.0000 | ORAL_TABLET | Freq: Three times a day (TID) | ORAL | 0 refills | Status: DC | PRN

## 2017-03-19 MED ORDER — HYDROCODONE-ACETAMINOPHEN 10-325 MG PO TABS: 1.0000 | ORAL_TABLET | Freq: Four times a day (QID) | ORAL | 0 refills | Status: DC | PRN

## 2017-03-19 MED ORDER — FLUTICASONE PROPIONATE HFA 220 MCG/ACT IN AERO: 2.0000 | INHALATION_SPRAY | Freq: Two times a day (BID) | RESPIRATORY_TRACT | 12 refills | Status: AC

## 2017-03-19 NOTE — Progress Notes (Signed)
BP 136/89   Pulse (!) 128   Temp 98.1 F (36.7 C) (Oral)   Ht 6' (1.829 m)   Wt 152 lb (68.9 kg)   BMI 20.61 kg/m    Subjective:    Patient ID: Mitchell May, male    DOB: Jun 14, 1965, 51 y.o.   MRN: [id number]  HPI: KEYANDRE PILEGGI is a 51 y.o. male presenting on 03/19/2017 for Follow-up and Medication Refill  This patient comes in for periodic recheck on medications and conditions including degenerative disc disease, arthritis and COPD.  He does have chronic bronchitis.  He has daily production.  He denies any fever or chills.  He does have wheezing at times.  He is stable with his pain medications.  He is not able to afford the Brio due to the brand-name status.  We are going to have him try to get Flovent generic 220 mcg 2 puffs twice daily.  He is reminded to rinse his mouth afterwards..   All medications are reviewed today. There are no reports of any problems with the medications. All of the medical conditions are reviewed and updated.  Lab work is reviewed and will be ordered as medically necessary. There are no new problems reported with today's visit.   Relevant past medical, surgical, family and social history reviewed and updated as indicated. Allergies and medications reviewed and updated.  Past Medical History:  Diagnosis Date  . GERD (gastroesophageal reflux disease)   . Heart murmur   . Seizures ([place])    one >10 yrs ? reason    Past Surgical History:  Procedure Laterality Date  . CERVICAL LAMINECTOMY      Review of Systems  Constitutional: Negative.  Negative for appetite change and fatigue.  HENT: Negative.   Eyes: Negative.  Negative for pain and visual disturbance.  Respiratory: Positive for cough and wheezing. Negative for chest tightness and shortness of breath.   Cardiovascular: Negative.  Negative for chest pain, palpitations and leg swelling.  Gastrointestinal: Negative.  Negative for abdominal pain, diarrhea, nausea and vomiting.  Endocrine:  Negative.   Genitourinary: Negative.   Musculoskeletal: Positive for arthralgias, back pain and joint swelling.  Skin: Negative.  Negative for color change and rash.  Neurological: Negative.  Negative for weakness, numbness and headaches.  Psychiatric/Behavioral: Negative.     Allergies as of 03/19/2017      Reactions   Penicillins Other (See Comments)   Childhood reaction      Medication List       Accurate as of 03/19/17  8:43 AM. Always use your most recent med list.          fluticasone 220 MCG/ACT inhaler Commonly known as:  FLOVENT HFA Inhale 2 puffs into the lungs 2 (two) times daily.   gabapentin 300 MG capsule Commonly known as:  NEURONTIN Take 1 capsule (300 mg total) by mouth 2 (two) times daily as needed. For nerve pain   HYDROcodone-acetaminophen 10-325 MG tablet Commonly known as:  NORCO Take 1 tablet by mouth every 6 (six) hours as needed.   HYDROcodone-acetaminophen 10-325 MG tablet Commonly known as:  NORCO Take 1 tablet by mouth every 8 (eight) hours as needed.   HYDROcodone-acetaminophen 10-325 MG tablet Commonly known as:  NORCO Take 1 tablet by mouth every 8 (eight) hours as needed.   loratadine 10 MG tablet Commonly known as:  CLARITIN Take 1 tablet (10 mg total) by mouth daily.   sildenafil 20 MG tablet Commonly known as:  REVATIO Take 1-4 tablets (20-80 mg total) by mouth as directed. Take 30 minutes before intercourse          Objective:    BP 136/89   Pulse (!) 128   Temp 98.1 F (36.7 C) (Oral)   Ht 6' (1.829 m)   Wt 152 lb (68.9 kg)   BMI 20.61 kg/m   Allergies  Allergen Reactions  . Penicillins Other (See Comments)    Childhood reaction    Physical Exam  Constitutional: He appears well-developed and well-nourished. No distress.  HENT:  Head: Normocephalic and atraumatic.  Eyes: Pupils are equal, round, and reactive to light. Conjunctivae and EOM are normal.  Cardiovascular: Normal rate, regular rhythm and normal  heart sounds.   Pulmonary/Chest: Effort normal and breath sounds normal. No respiratory distress.  Skin: Skin is warm and dry.  Psychiatric: He has a normal mood and affect. His behavior is normal.  Nursing note and vitals reviewed.   Results for orders placed or performed in visit on 09/04/16  CMP14+EGFR  Result Value Ref Range   Glucose 82 65 - 99 mg/dL   BUN 6 6 - 24 mg/dL   Creatinine, Ser 0.82 0.76 - 1.27 mg/dL   GFR calc non Af Amer 105 >59 mL/min/1.73   GFR calc Af Amer 121 >59 mL/min/1.73   BUN/Creatinine Ratio 7 (L) 9 - 20   Sodium 137 134 - 144 mmol/L   Potassium 5.0 3.5 - 5.2 mmol/L   Chloride 94 (L) 96 - 106 mmol/L   CO2 21 18 - 29 mmol/L   Calcium 8.9 8.7 - 10.2 mg/dL   Total Protein 7.6 6.0 - 8.5 g/dL   Albumin 4.5 3.5 - 5.5 g/dL   Globulin, Total 3.1 1.5 - 4.5 g/dL   Albumin/Globulin Ratio 1.5 1.2 - 2.2   Bilirubin Total 0.3 0.0 - 1.2 mg/dL   Alkaline Phosphatase 55 39 - 117 IU/L   AST 38 0 - 40 IU/L   ALT 28 0 - 44 IU/L  Lipid panel  Result Value Ref Range   Cholesterol, Total 190 100 - 199 mg/dL   Triglycerides 72 0 - 149 mg/dL   HDL 72 >39 mg/dL   VLDL Cholesterol Cal 14 5 - 40 mg/dL   LDL Calculated 104 (H) 0 - 99 mg/dL   Chol/HDL Ratio 2.6 0.0 - 5.0 ratio  CBC with Differential/Platelet  Result Value Ref Range   WBC 7.7 3.4 - 10.8 x10E3/uL   RBC 4.58 4.14 - 5.80 x10E6/uL   Hemoglobin 14.6 13.0 - 17.7 g/dL   Hematocrit 42.6 37.5 - 51.0 %   MCV 93 79 - 97 fL   MCH 31.9 26.6 - 33.0 pg   MCHC 34.3 31.5 - 35.7 g/dL   RDW 14.1 12.3 - 15.4 %   Platelets 262 150 - 379 x10E3/uL   Neutrophils 47 Not Estab. %   Lymphs 31 Not Estab. %   Monocytes 12 Not Estab. %   Eos 7 Not Estab. %   Basos 2 Not Estab. %   Neutrophils Absolute 3.8 1.4 - 7.0 x10E3/uL   Lymphocytes Absolute 2.4 0.7 - 3.1 x10E3/uL   Monocytes Absolute 0.9 0.1 - 0.9 x10E3/uL   EOS (ABSOLUTE) 0.5 (H) 0.0 - 0.4 x10E3/uL   Basophils Absolute 0.1 0.0 - 0.2 x10E3/uL   Immature Granulocytes 1  Not Estab. %   Immature Grans (Abs) 0.1 0.0 - 0.1 x10E3/uL      Assessment & Plan:   1. DDD (degenerative disc  disease), lumbar - HYDROcodone-acetaminophen (NORCO) 10-325 MG tablet; Take 1 tablet by mouth every 6 (six) hours as needed.  Dispense: 120 tablet; Refill: 0 - HYDROcodone-acetaminophen (NORCO) 10-325 MG tablet; Take 1 tablet by mouth every 8 (eight) hours as needed.  Dispense: 120 tablet; Refill: 0 - HYDROcodone-acetaminophen (NORCO) 10-325 MG tablet; Take 1 tablet by mouth every 8 (eight) hours as needed.  Dispense: 120 tablet; Refill: 0  2. Arthritis - HYDROcodone-acetaminophen (NORCO) 10-325 MG tablet; Take 1 tablet by mouth every 6 (six) hours as needed.  Dispense: 120 tablet; Refill: 0 - HYDROcodone-acetaminophen (NORCO) 10-325 MG tablet; Take 1 tablet by mouth every 8 (eight) hours as needed.  Dispense: 120 tablet; Refill: 0 - HYDROcodone-acetaminophen (NORCO) 10-325 MG tablet; Take 1 tablet by mouth every 8 (eight) hours as needed.  Dispense: 120 tablet; Refill: 0  3. Mucopurulent chronic bronchitis (HCC) - fluticasone (FLOVENT HFA) 220 MCG/ACT inhaler; Inhale 2 puffs into the lungs 2 (two) times daily.  Dispense: 1 Inhaler; Refill: 12  4. Need for immunization against influenza - Flu Vaccine QUAD 36+ mos IM    Current Outpatient Prescriptions:  .  gabapentin (NEURONTIN) 300 MG capsule, Take 1 capsule (300 mg total) by mouth 2 (two) times daily as needed. For nerve pain, Disp: 60 capsule, Rfl: 5 .  HYDROcodone-acetaminophen (NORCO) 10-325 MG tablet, Take 1 tablet by mouth every 6 (six) hours as needed., Disp: 120 tablet, Rfl: 0 .  HYDROcodone-acetaminophen (NORCO) 10-325 MG tablet, Take 1 tablet by mouth every 8 (eight) hours as needed., Disp: 120 tablet, Rfl: 0 .  HYDROcodone-acetaminophen (NORCO) 10-325 MG tablet, Take 1 tablet by mouth every 8 (eight) hours as needed., Disp: 120 tablet, Rfl: 0 .  loratadine (CLARITIN) 10 MG tablet, Take 1 tablet (10 mg total) by mouth  daily., Disp: 30 tablet, Rfl: 11 .  sildenafil (REVATIO) 20 MG tablet, Take 1-4 tablets (20-80 mg total) by mouth as directed. Take 30 minutes before intercourse, Disp: 30 tablet, Rfl: 2 .  fluticasone (FLOVENT HFA) 220 MCG/ACT inhaler, Inhale 2 puffs into the lungs 2 (two) times daily., Disp: 1 Inhaler, Rfl: 12 Continue all other maintenance medications as listed above.  Follow up plan: Return in about 3 months (around 06/19/2017) for recheck.  Educational handout given for [place] PA-C [facility] [address]  [place], [place] [address] [phone]   03/19/2017, 8:43 AM

## 2017-03-19 NOTE — Patient Instructions (Signed)
In a few days you may receive a survey in the mail or online from [employer] regarding your visit with us today. Please take a moment to fill this out. Your feedback is very important to our whole office. It can help us better understand your needs as well as improve your experience and satisfaction. Thank you for taking your time to complete it. We care about you.  Geanie Pacifico, PA-C  

## 2017-03-20 NOTE — Telephone Encounter (Signed)
Last seen yesterday Mitchell May

## 2017-06-18 ENCOUNTER — Ambulatory Visit (INDEPENDENT_AMBULATORY_CARE_PROVIDER_SITE_OTHER): Payer: 59 | Admitting: Physician Assistant

## 2017-06-18 ENCOUNTER — Encounter: Payer: Self-pay | Admitting: Physician Assistant

## 2017-06-18 VITALS — BP 127/89 | HR 95 | Temp 96.2°F | Ht 72.0 in | Wt 150.8 lb

## 2017-06-18 DIAGNOSIS — M5136 Other intervertebral disc degeneration, lumbar region: Secondary | ICD-10-CM | POA: Diagnosis not present

## 2017-06-18 DIAGNOSIS — M1990 Unspecified osteoarthritis, unspecified site: Secondary | ICD-10-CM | POA: Diagnosis not present

## 2017-06-18 DIAGNOSIS — F321 Major depressive disorder, single episode, moderate: Secondary | ICD-10-CM

## 2017-06-18 MED ORDER — HYDROCODONE-ACETAMINOPHEN 10-325 MG PO TABS: 1.0000 | ORAL_TABLET | Freq: Four times a day (QID) | ORAL | 0 refills | Status: DC | PRN

## 2017-06-18 MED ORDER — CITALOPRAM HYDROBROMIDE 20 MG PO TABS: 20.0000 mg | ORAL_TABLET | Freq: Every day | ORAL | 5 refills | Status: AC

## 2017-06-18 MED ORDER — HYDROCODONE-ACETAMINOPHEN 10-325 MG PO TABS: 1.0000 | ORAL_TABLET | Freq: Three times a day (TID) | ORAL | 0 refills | Status: DC | PRN

## 2017-06-18 NOTE — Progress Notes (Signed)
BP 127/89   Pulse 95   Temp (!) 96.2 F (35.7 C) (Oral)   Ht 6' (1.829 m)   Wt 150 lb 12.8 oz (68.4 kg)   BMI 20.45 kg/m    Subjective:    Patient ID: Mitchell May, male    DOB: 1965/12/14, 52 y.o.   MRN: [id number]  HPI: Mitchell May is a 52 y.o. male presenting on 06/18/2017 for Follow-up and Medication Refill This patient comes in for periodic recheck on medications and conditions including ddd, arthritis, depression. Lots of stressors with new job coming up, mother with relational difficulties with everyone.  Still drinks beer.  Depression screen [facility] 2/9 06/18/2017 03/19/2017 11/30/2016 09/04/2016 08/29/2016  Decreased Interest 1 1 1 2 2   Down, Depressed, Hopeless 1 0 0 2 2  PHQ - 2 Score 2 1 1 4 4   Altered sleeping 1 - - 0 0  Tired, decreased energy 1 - - 1 1  Change in appetite 2 - - 2 2  Feeling bad or failure about yourself  0 - - 2 2  Trouble concentrating 2 - - 0 0  Moving slowly or fidgety/restless 2 - - 1 1  Suicidal thoughts 0 - - 0 0  PHQ-9 Score 10 - - 10 10    All medications are reviewed today. There are no reports of any problems with the medications. All of the medical conditions are reviewed and updated.  Lab work is reviewed and will be ordered as medically necessary. There are no new problems reported with today's visit.    Relevant past medical, surgical, family and social history reviewed and updated as indicated. Allergies and medications reviewed and updated.  Past Medical History:  Diagnosis Date  . GERD (gastroesophageal reflux disease)   . Heart murmur   . Seizures ([place])    one >10 yrs ? reason    Past Surgical History:  Procedure Laterality Date  . CERVICAL LAMINECTOMY      Review of Systems  Constitutional: Negative.  Negative for appetite change and fatigue.  HENT: Negative.   Eyes: Negative.  Negative for pain and visual disturbance.  Respiratory: Negative.  Negative for cough, chest tightness, shortness of breath and  wheezing.   Cardiovascular: Negative.  Negative for chest pain, palpitations and leg swelling.  Gastrointestinal: Negative.  Negative for abdominal pain, diarrhea, nausea and vomiting.  Endocrine: Negative.   Genitourinary: Negative.   Musculoskeletal: Positive for arthralgias and back pain.  Skin: Negative.  Negative for color change and rash.  Neurological: Negative.  Negative for weakness, numbness and headaches.  Psychiatric/Behavioral: Positive for dysphoric mood. The patient is nervous/anxious.     Allergies as of 06/18/2017      Reactions   Penicillins Other (See Comments)   Childhood reaction      Medication List        Accurate as of 06/18/17  5:06 PM. Always use your most recent med list.          citalopram 20 MG tablet Commonly known as:  CELEXA Take 1 tablet (20 mg total) by mouth daily.   fluticasone 220 MCG/ACT inhaler Commonly known as:  FLOVENT HFA Inhale 2 puffs into the lungs 2 (two) times daily.   gabapentin 300 MG capsule Commonly known as:  NEURONTIN Take 1 capsule (300 mg total) by mouth 2 (two) times daily as needed. For nerve pain   HYDROcodone-acetaminophen 10-325 MG tablet Commonly known as:  NORCO Take 1 tablet by mouth every  6 (six) hours as needed.   HYDROcodone-acetaminophen 10-325 MG tablet Commonly known as:  NORCO Take 1 tablet by mouth every 8 (eight) hours as needed.   HYDROcodone-acetaminophen 10-325 MG tablet Commonly known as:  NORCO Take 1 tablet by mouth every 8 (eight) hours as needed.   loratadine 10 MG tablet Commonly known as:  CLARITIN Take 1 tablet (10 mg total) by mouth daily.   sildenafil 20 MG tablet Commonly known as:  REVATIO TAKE UP TO 4 TABLETS AS DIRECTED 30 MINUTES BEFORE INTERCOURSE          Objective:    BP 127/89   Pulse 95   Temp (!) 96.2 F (35.7 C) (Oral)   Ht 6' (1.829 m)   Wt 150 lb 12.8 oz (68.4 kg)   BMI 20.45 kg/m   Allergies  Allergen Reactions  . Penicillins Other (See Comments)     Childhood reaction    Physical Exam  Constitutional: He appears well-developed and well-nourished. No distress.  HENT:  Head: Normocephalic and atraumatic.  Eyes: Conjunctivae and EOM are normal. Pupils are equal, round, and reactive to light.  Cardiovascular: Normal rate, regular rhythm and normal heart sounds.  Pulmonary/Chest: Effort normal and breath sounds normal. No respiratory distress.  Skin: Skin is warm and dry.  Psychiatric: He has a normal mood and affect. His behavior is normal.  Nursing note and vitals reviewed.   Results for orders placed or performed in visit on 09/04/16  CMP14+EGFR  Result Value Ref Range   Glucose 82 65 - 99 mg/dL   BUN 6 6 - 24 mg/dL   Creatinine, Ser 0.82 0.76 - 1.27 mg/dL   GFR calc non Af Amer 105 >59 mL/min/1.73   GFR calc Af Amer 121 >59 mL/min/1.73   BUN/Creatinine Ratio 7 (L) 9 - 20   Sodium 137 134 - 144 mmol/L   Potassium 5.0 3.5 - 5.2 mmol/L   Chloride 94 (L) 96 - 106 mmol/L   CO2 21 18 - 29 mmol/L   Calcium 8.9 8.7 - 10.2 mg/dL   Total Protein 7.6 6.0 - 8.5 g/dL   Albumin 4.5 3.5 - 5.5 g/dL   Globulin, Total 3.1 1.5 - 4.5 g/dL   Albumin/Globulin Ratio 1.5 1.2 - 2.2   Bilirubin Total 0.3 0.0 - 1.2 mg/dL   Alkaline Phosphatase 55 39 - 117 IU/L   AST 38 0 - 40 IU/L   ALT 28 0 - 44 IU/L  Lipid panel  Result Value Ref Range   Cholesterol, Total 190 100 - 199 mg/dL   Triglycerides 72 0 - 149 mg/dL   HDL 72 >39 mg/dL   VLDL Cholesterol Cal 14 5 - 40 mg/dL   LDL Calculated 104 (H) 0 - 99 mg/dL   Chol/HDL Ratio 2.6 0.0 - 5.0 ratio  CBC with Differential/Platelet  Result Value Ref Range   WBC 7.7 3.4 - 10.8 x10E3/uL   RBC 4.58 4.14 - 5.80 x10E6/uL   Hemoglobin 14.6 13.0 - 17.7 g/dL   Hematocrit 42.6 37.5 - 51.0 %   MCV 93 79 - 97 fL   MCH 31.9 26.6 - 33.0 pg   MCHC 34.3 31.5 - 35.7 g/dL   RDW 14.1 12.3 - 15.4 %   Platelets 262 150 - 379 x10E3/uL   Neutrophils 47 Not Estab. %   Lymphs 31 Not Estab. %   Monocytes 12 Not  Estab. %   Eos 7 Not Estab. %   Basos 2 Not Estab. %  Neutrophils Absolute 3.8 1.4 - 7.0 x10E3/uL   Lymphocytes Absolute 2.4 0.7 - 3.1 x10E3/uL   Monocytes Absolute 0.9 0.1 - 0.9 x10E3/uL   EOS (ABSOLUTE) 0.5 (H) 0.0 - 0.4 x10E3/uL   Basophils Absolute 0.1 0.0 - 0.2 x10E3/uL   Immature Granulocytes 1 Not Estab. %   Immature Grans (Abs) 0.1 0.0 - 0.1 x10E3/uL      Assessment & Plan:   1. DDD (degenerative disc disease), lumbar - HYDROcodone-acetaminophen (NORCO) 10-325 MG tablet; Take 1 tablet by mouth every 6 (six) hours as needed.  Dispense: 120 tablet; Refill: 0 - HYDROcodone-acetaminophen (NORCO) 10-325 MG tablet; Take 1 tablet by mouth every 8 (eight) hours as needed.  Dispense: 120 tablet; Refill: 0 - HYDROcodone-acetaminophen (NORCO) 10-325 MG tablet; Take 1 tablet by mouth every 8 (eight) hours as needed.  Dispense: 120 tablet; Refill: 0  2. Arthritis - HYDROcodone-acetaminophen (NORCO) 10-325 MG tablet; Take 1 tablet by mouth every 6 (six) hours as needed.  Dispense: 120 tablet; Refill: 0 - HYDROcodone-acetaminophen (NORCO) 10-325 MG tablet; Take 1 tablet by mouth every 8 (eight) hours as needed.  Dispense: 120 tablet; Refill: 0 - HYDROcodone-acetaminophen (NORCO) 10-325 MG tablet; Take 1 tablet by mouth every 8 (eight) hours as needed.  Dispense: 120 tablet; Refill: 0  3. Depression, major, single episode, moderate (HCC) - citalopram (CELEXA) 20 MG tablet; Take 1 tablet (20 mg total) by mouth daily.  Dispense: 30 tablet; Refill: 5    Current Outpatient Medications:  .  citalopram (CELEXA) 20 MG tablet, Take 1 tablet (20 mg total) by mouth daily., Disp: 30 tablet, Rfl: 5 .  fluticasone (FLOVENT HFA) 220 MCG/ACT inhaler, Inhale 2 puffs into the lungs 2 (two) times daily., Disp: 1 Inhaler, Rfl: 12 .  gabapentin (NEURONTIN) 300 MG capsule, Take 1 capsule (300 mg total) by mouth 2 (two) times daily as needed. For nerve pain, Disp: 60 capsule, Rfl: 5 .  HYDROcodone-acetaminophen  (NORCO) 10-325 MG tablet, Take 1 tablet by mouth every 6 (six) hours as needed., Disp: 120 tablet, Rfl: 0 .  HYDROcodone-acetaminophen (NORCO) 10-325 MG tablet, Take 1 tablet by mouth every 8 (eight) hours as needed., Disp: 120 tablet, Rfl: 0 .  HYDROcodone-acetaminophen (NORCO) 10-325 MG tablet, Take 1 tablet by mouth every 8 (eight) hours as needed., Disp: 120 tablet, Rfl: 0 .  loratadine (CLARITIN) 10 MG tablet, Take 1 tablet (10 mg total) by mouth daily., Disp: 30 tablet, Rfl: 11 .  sildenafil (REVATIO) 20 MG tablet, TAKE UP TO 4 TABLETS AS DIRECTED 30 MINUTES BEFORE INTERCOURSE, Disp: 30 tablet, Rfl: 2 Continue all other maintenance medications as listed above.  Follow up plan: Return in about 3 months (around 09/16/2017) for recheck.  Educational handout given for [place] PA-C [place] [address]  [place], [place] [address] [phone]   06/18/2017, 5:06 PM

## 2017-06-18 NOTE — Patient Instructions (Signed)
In a few days you may receive a survey in the mail or online from [employer] regarding your visit with us today. Please take a moment to fill this out. Your feedback is very important to our whole office. It can help us better understand your needs as well as improve your experience and satisfaction. Thank you for taking your time to complete it. We care about you.  Ximenna Fonseca, PA-C  

## 2017-06-22 ENCOUNTER — Telehealth: Payer: Self-pay | Admitting: Physician Assistant

## 2017-06-22 DIAGNOSIS — N529 Male erectile dysfunction, unspecified: Secondary | ICD-10-CM

## 2017-06-22 NOTE — Telephone Encounter (Signed)
What is the name of the medication? sildenafil (REVATIO) 20 MG tablet  Have you contacted your pharmacy to request a refill? No pt is switching to this pharmacy  Which pharmacy would you like this sent to? [facility]   Patient notified that their request is being sent to the clinical staff for review and that they should receive a call once it is complete. If they do not receive a call within 24 hours they can check with their pharmacy or our office.

## 2017-06-25 MED ORDER — SILDENAFIL CITRATE 20 MG PO TABS: ORAL_TABLET | ORAL | 2 refills | Status: AC

## 2017-06-25 NOTE — Telephone Encounter (Signed)
sent 

## 2017-07-03 ENCOUNTER — Ambulatory Visit (INDEPENDENT_AMBULATORY_CARE_PROVIDER_SITE_OTHER): Payer: 59

## 2017-07-03 ENCOUNTER — Encounter: Payer: Self-pay | Admitting: Family Medicine

## 2017-07-03 ENCOUNTER — Ambulatory Visit: Payer: 59 | Admitting: Family Medicine

## 2017-07-03 VITALS — BP 184/123 | HR 118 | Temp 97.2°F | Ht 72.0 in | Wt 149.0 lb

## 2017-07-03 DIAGNOSIS — H532 Diplopia: Secondary | ICD-10-CM | POA: Diagnosis not present

## 2017-07-03 DIAGNOSIS — R05 Cough: Secondary | ICD-10-CM | POA: Diagnosis not present

## 2017-07-03 DIAGNOSIS — S20211A Contusion of right front wall of thorax, initial encounter: Secondary | ICD-10-CM

## 2017-07-03 DIAGNOSIS — R29818 Other symptoms and signs involving the nervous system: Secondary | ICD-10-CM | POA: Diagnosis not present

## 2017-07-03 DIAGNOSIS — R059 Cough, unspecified: Secondary | ICD-10-CM

## 2017-07-03 NOTE — Progress Notes (Signed)
Subjective:  Patient ID: Mitchell May, male    DOB: May 28, 1965  Age: 52 y.o. MRN: [id number]  CC: Diplopia (pt here today c/o double vision and some dizziness that him and his wife think might be associated with vertigo)   HPI Mitchell May presents for onset 6 months ago with double vision.  During that time he is also from balance he lists to one side usually the right.  He falls fairly frequently.  He was noted to be staggering and off balance at work by his employer.  Patient occasionally drinks alcohol.  He denies being drunk during any of these instances.  He says that there is been no sense of the room moving but sometimes he can look at a wall and it feels like things are moving up and down the wall.  He denies hallucination.  He denies loss of consciousness when he falls he just gets off balance and falls.  Recently fell on his right side and it is still sore.  Is having some cough as well.  He denies shortness of breath.  He is a smoker.  Last night he was found by his wife on the front porch leaning to the railing.  He was unable to move his legs.  He was standing and had adequate strength for standing, but he could not move.  Of note is that he takes his gabapentin irregularly.  He has taken it within the last 2 weeks. Depression screen [facility] 2/9 07/03/2017 06/18/2017 03/19/2017  Decreased Interest 0 1 1  Down, Depressed, Hopeless 0 1 0  PHQ - 2 Score 0 2 1  Altered sleeping - 1 -  Tired, decreased energy - 1 -  Change in appetite - 2 -  Feeling bad or failure about yourself  - 0 -  Trouble concentrating - 2 -  Moving slowly or fidgety/restless - 2 -  Suicidal thoughts - 0 -  PHQ-9 Score - 10 -    History Mitchell May has a past medical history of GERD (gastroesophageal reflux disease), Heart murmur, and Seizures (HCC).   He has a past surgical history that includes Cervical laminectomy.   His family history is not on file.He reports that he has been smoking cigarettes.  He has  a 25.00 pack-year smoking history. he has never used smokeless tobacco. He reports that he drinks alcohol. He reports that he does not use drugs.    ROS Review of Systems  Constitutional: Negative for chills, diaphoresis, fever and unexpected weight change.  HENT: Negative for congestion, hearing loss, rhinorrhea and sore throat.   Eyes: Negative for visual disturbance.  Respiratory: Positive for cough. Negative for shortness of breath.   Cardiovascular: Negative for chest pain.  Gastrointestinal: Negative for abdominal pain, constipation and diarrhea.  Genitourinary: Negative for dysuria and flank pain.  Musculoskeletal: Positive for gait problem. Negative for arthralgias and joint swelling.  Skin: Negative for rash.  Neurological: Positive for dizziness. Negative for tremors, seizures, syncope, light-headedness and headaches.  Psychiatric/Behavioral: Negative for dysphoric mood and sleep disturbance.    Objective:  BP (!) 184/123   Pulse (!) 118   Temp (!) 97.2 F (36.2 C) (Oral)   Ht 6' (1.829 m)   Wt 149 lb (67.6 kg)   BMI 20.21 kg/m   BP Readings from Last 3 Encounters:  07/03/17 (!) 184/123  06/18/17 127/89  03/19/17 136/89    Wt Readings from Last 3 Encounters:  07/03/17 149 lb (67.6 kg)  06/18/17 150  lb 12.8 oz (68.4 kg)  03/19/17 152 lb (68.9 kg)     Physical Exam  Constitutional: He is oriented to person, place, and time. He appears well-developed and well-nourished. No distress.  HENT:  Head: Normocephalic and atraumatic.  Right Ear: External ear normal.  Left Ear: External ear normal.  Nose: Nose normal.  Mouth/Throat: Oropharynx is clear and moist.  Eyes: Conjunctivae and EOM are normal. Pupils are equal, round, and reactive to light.  Neck: Normal range of motion. Neck supple. No thyromegaly present.  Cardiovascular: Normal rate, regular rhythm and normal heart sounds.  No murmur heard. Pulmonary/Chest: Effort normal and breath sounds normal. No  respiratory distress. He has no wheezes. He has no rales.  Right posterior rub near base  Abdominal: Soft. Bowel sounds are normal. He exhibits no distension. There is no tenderness.  Lymphadenopathy:    He has no cervical adenopathy.  Neurological: He is alert and oriented to person, place, and time. He has normal reflexes. He displays normal reflexes. No cranial nerve deficit. He exhibits normal muscle tone. Coordination normal.  Skin: Skin is warm and dry.  Psychiatric: He has a normal mood and affect. His behavior is normal. Judgment and thought content normal.      Assessment & Plan:   Mitchell May was seen today for diplopia.  Diagnoses and all orders for this visit:  Diplopia -     Ambulatory referral to Neurology -     Cancel: CT CHEST W WO CONTRAST; Future  Contusion of right chest wall, initial encounter -     DG Chest 2 View; Future -     Ambulatory referral to Neurology -     Cancel: CT CHEST W WO CONTRAST; Future  Cough -     DG Chest 2 View; Future  Other symptoms and signs involving the nervous system -     CT Head Wo Contrast; Future       I am having Mitchell May maintain his gabapentin, loratadine, fluticasone, HYDROcodone-acetaminophen, HYDROcodone-acetaminophen, HYDROcodone-acetaminophen, citalopram, and sildenafil.  Allergies as of 07/03/2017      Reactions   Penicillins Other (See Comments)   Childhood reaction      Medication List        Accurate as of 07/03/17  4:01 PM. Always use your most recent med list.          citalopram 20 MG tablet Commonly known as:  CELEXA Take 1 tablet (20 mg total) by mouth daily.   fluticasone 220 MCG/ACT inhaler Commonly known as:  FLOVENT HFA Inhale 2 puffs into the lungs 2 (two) times daily.   gabapentin 300 MG capsule Commonly known as:  NEURONTIN Take 1 capsule (300 mg total) by mouth 2 (two) times daily as needed. For nerve pain   HYDROcodone-acetaminophen 10-325 MG tablet Commonly known as:   NORCO Take 1 tablet by mouth every 6 (six) hours as needed.   HYDROcodone-acetaminophen 10-325 MG tablet Commonly known as:  NORCO Take 1 tablet by mouth every 8 (eight) hours as needed.   HYDROcodone-acetaminophen 10-325 MG tablet Commonly known as:  NORCO Take 1 tablet by mouth every 8 (eight) hours as needed.   loratadine 10 MG tablet Commonly known as:  CLARITIN Take 1 tablet (10 mg total) by mouth daily.   sildenafil 20 MG tablet Commonly known as:  REVATIO TAKE UP TO 4 TABLETS AS DIRECTED 30 MINUTES BEFORE INTERCOURSE        Follow-up: Return in about 2 weeks (around 07/17/2017).  Claretta Fraise, M.D.

## 2017-07-04 ENCOUNTER — Telehealth: Payer: Self-pay | Admitting: Physician Assistant

## 2017-07-04 NOTE — Telephone Encounter (Signed)
Pt [facility] Sh eis out of the office until Friday - he is aware .

## 2017-07-05 NOTE — Telephone Encounter (Signed)
That is okay.

## 2017-07-05 NOTE — Telephone Encounter (Signed)
Note written and faxed to [phone] to [email or web address] at [email or web address]. Pt aware

## 2017-07-16 ENCOUNTER — Ambulatory Visit: Payer: 59 | Admitting: Neurology

## 2017-07-16 ENCOUNTER — Telehealth: Payer: Self-pay | Admitting: *Deleted

## 2017-07-16 NOTE — Telephone Encounter (Signed)
No showed new patient appointment. 

## 2017-07-17 ENCOUNTER — Ambulatory Visit: Payer: 59 | Admitting: Physician Assistant

## 2017-07-17 ENCOUNTER — Encounter: Payer: Self-pay | Admitting: Neurology

## 2017-07-24 ENCOUNTER — Ambulatory Visit
Admission: RE | Admit: 2017-07-24 | Discharge: 2017-07-24 | Disposition: A | Discharge status: Home / Self Care | Payer: 59 | Source: Ambulatory Visit | Attending: Family Medicine | Admitting: Family Medicine

## 2017-07-24 DIAGNOSIS — R29818 Other symptoms and signs involving the nervous system: Secondary | ICD-10-CM

## 2017-07-25 ENCOUNTER — Encounter: Payer: Self-pay | Admitting: Family Medicine

## 2017-07-25 ENCOUNTER — Other Ambulatory Visit: Payer: Self-pay | Admitting: Family Medicine

## 2017-07-25 DIAGNOSIS — I6381 Other cerebral infarction due to occlusion or stenosis of small artery: Secondary | ICD-10-CM | POA: Insufficient documentation

## 2017-07-25 MED ORDER — ASPIRIN-DIPYRIDAMOLE ER 25-200 MG PO CP12: 1.0000 | ORAL_CAPSULE | Freq: Two times a day (BID) | ORAL | 2 refills | Status: DC

## 2017-07-26 ENCOUNTER — Other Ambulatory Visit: Payer: Self-pay | Admitting: *Deleted

## 2017-07-26 MED ORDER — ASPIRIN-DIPYRIDAMOLE ER 25-200 MG PO CP12: 1.0000 | ORAL_CAPSULE | Freq: Two times a day (BID) | ORAL | 2 refills | Status: AC

## 2017-07-31 ENCOUNTER — Encounter: Payer: Self-pay | Admitting: Neurology

## 2017-07-31 ENCOUNTER — Ambulatory Visit: Payer: 59 | Admitting: Neurology

## 2017-07-31 VITALS — BP 137/92 | HR 107 | Ht 72.0 in | Wt 149.0 lb

## 2017-07-31 DIAGNOSIS — R269 Unspecified abnormalities of gait and mobility: Secondary | ICD-10-CM | POA: Diagnosis not present

## 2017-07-31 DIAGNOSIS — F1010 Alcohol abuse, uncomplicated: Secondary | ICD-10-CM | POA: Diagnosis not present

## 2017-07-31 DIAGNOSIS — G3281 Cerebellar ataxia in diseases classified elsewhere: Secondary | ICD-10-CM | POA: Diagnosis not present

## 2017-07-31 MED ORDER — VITAMIN B-1 100 MG PO TABS: 100.0000 mg | ORAL_TABLET | Freq: Every day | ORAL | 4 refills | Status: AC

## 2017-07-31 NOTE — Progress Notes (Signed)
PATIENT: Mitchell May DOB: Oct 14, 1965  Chief Complaint  Patient presents with  . Diplopia/Abnormal Gait    He is here with his wife, Mitchell May (they do not live together).  Reports episodes of double vision, staggering gait and leg weakness.  These symptoms have been causing falls.  He recently had an abnormal CT head showing stroke activity.  He drinks two or more 40oz beers per day and smokes 1 pack of cigarettes daily.  Marland Kitchen PCP    Remus Loffler, PA-C     HISTORICAL  Mitchell May is a 52 year old male,  seen in refer by his primary care PA Remus Loffler for evaluation of unsteady gait, initial evaluation was on July 31, 2017.  He is accompanied by his wife Mitchell May at today's clinical visit.  He has past medical history of long time alcohol abuse, 40 once of beers every day, a pack of cigarettes daily, he used to work as a Electrical engineer,  In summer 2018, he had acute onset of gait abnormality, unsteady gait, fell few times, has not able to go back to work since, but gradual worsening over the past few months, fell in July 19, 2017, stepping out of the car, without clear triggers, fell to the ground, could not get up, ambulance was called  He denies bilateral upper and lower extremity paresthesia, denies bowel bladder incontinence,  He had a history of cervical, lumbar decompression surgery in the past, was started on Aggrenox recently, but has not refilled his prescription yet.  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  REVIEW OF SYSTEMS: Full 14 system review of systems performed and notable only for fatigue, chills, blurred vision, double vision, wheezing, diarrhea, joint pain, achy muscles, runny nose, memory loss, confusion, weakness, slurred speech, dizziness, tremor, anxiety, decreased energy, disinterested in activities, change in  appetite  ALLERGIES: Allergies  Allergen Reactions  . Penicillins Other (See Comments)    Childhood reaction    HOME MEDICATIONS: Current Outpatient Medications  Medication Sig Dispense Refill  . citalopram (CELEXA) 20 MG tablet Take 1 tablet (20 mg total) by mouth daily. 30 tablet 5  . dipyridamole-aspirin (AGGRENOX) 200-25 MG 12hr capsule Take 1 capsule by mouth 2 (two) times daily. 60 capsule 2  . fluticasone (FLOVENT HFA) 220 MCG/ACT inhaler Inhale 2 puffs into the lungs 2 (two) times daily. 1 Inhaler 12  . HYDROcodone-acetaminophen (NORCO) 10-325 MG tablet Take 1 tablet by mouth every 6 (six) hours as needed. 120 tablet 0  . HYDROcodone-acetaminophen (NORCO) 10-325 MG tablet Take 1 tablet by mouth every 8 (eight) hours as needed. 120 tablet 0  . HYDROcodone-acetaminophen (NORCO) 10-325 MG tablet Take 1 tablet by mouth every 8 (eight) hours as needed. 120 tablet 0  . loratadine (CLARITIN) 10 MG tablet Take 1 tablet (10 mg total) by mouth daily. 30 tablet 11  . sildenafil (REVATIO) 20 MG tablet TAKE UP TO 4 TABLETS AS DIRECTED 30 MINUTES BEFORE INTERCOURSE 30 tablet 2   No current facility-administered medications for this visit.     PAST MEDICAL HISTORY: Past Medical History:  Diagnosis Date  . Alcoholism (HCC)   . GERD (gastroesophageal reflux disease)   . Heart murmur   . Seizures (HCC)    one >10 yrs ? reason    PAST SURGICAL HISTORY: Past Surgical History:  Procedure Laterality Date  . BACK SURGERY    .  CERVICAL LAMINECTOMY      FAMILY HISTORY: Family History  Problem Relation Age of Onset  . Stroke Mother   . Alzheimer's disease Father   . Throat cancer Brother   . Alzheimer's disease Paternal Grandmother   . Heart attack Brother     SOCIAL HISTORY:  Social History   Socioeconomic History  . Marital status: Married    Spouse name: Not on file  . Number of children: 2  . Years of education: 52  . Highest education level: High school graduate   Social Needs  . Financial resource strain: Not on file  . Food insecurity - worry: Not on file  . Food insecurity - inability: Not on file  . Transportation needs - medical: Not on file  . Transportation needs - non-medical: Not on file  Occupational History  . Occupation: security guard on medical leave    Comment: Hospital doctor  Tobacco Use  . Smoking status: Current Every Day Smoker    Packs/day: 1.00    Years: 25.00    Pack years: 25.00    Types: Cigarettes  . Smokeless tobacco: Never Used  . Tobacco comment: 6pk - 12pk a day beer  Substance and Sexual Activity  . Alcohol use: Yes    Comment: Two or more 40oz beers per day.  . Drug use: No  . Sexual activity: Not on file  Other Topics Concern  . Not on file  Social History Narrative   Lives at home alone.   Right-handed.   Occasional caffeine use.     PHYSICAL EXAM   Vitals:   07/31/17 1310  BP: (!) 137/92  Pulse: (!) 107  Weight: 149 lb (67.6 kg)  Height: 6' (1.829 m)    Not recorded      Body mass index is 20.21 kg/m.  PHYSICAL EXAMNIATION:  Gen: NAD, conversant, well nourised, obese, well groomed                     Cardiovascular: Regular rate rhythm, no peripheral edema, warm, nontender. Eyes: Conjunctivae clear without exudates or hemorrhage Neck: Supple, no carotid bruits. Pulmonary: Clear to auscultation bilaterally   NEUROLOGICAL EXAM:  MENTAL STATUS: Speech:    Speech is normal; fluent and spontaneous with normal comprehension.  Cognition:     Orientation to time, place and person     Normal recent and remote memory     Normal Attention span and concentration     Normal Language, naming, repeating,spontaneous speech     Fund of knowledge   CRANIAL NERVES: CN II: Visual fields are full to confrontation. Fundoscopic exam is normal with sharp discs and no vascular changes. Pupils are round equal and briskly reactive to light. CN III, IV, VI: He has spontaneous vertical nystagmus,  large amplitude horizontal nystagmus to gaze direction, CN V: Facial sensation is intact to pinprick in all 3 divisions bilaterally. Corneal responses are intact.  CN VII: Face is symmetric with normal eye closure and smile. CN VIII: Hearing is normal to rubbing fingers CN IX, X: Palate elevates symmetrically. Phonation is normal. CN XI: Head turning and shoulder shrug are intact CN XII: Tongue is midline with normal movements and no atrophy.  MOTOR: There is no pronator drift of out-stretched arms. Muscle bulk and tone are normal. Muscle strength is normal.  REFLEXES: Reflexes are 2+ and symmetric at the biceps, triceps, knees, and ankles. Plantar responses are flexor.  SENSORY: Intact to light touch, pinprick, positional sensation and  vibratory sensation are intact in fingers and toes.  COORDINATION: He has mild finger to nose, heel to shin dysmetria bilaterally, moderate truncal ataxia  GAIT/STANCE: He needs pushed up to get up from seated position, wide-based, cautious, unsteady gait,  DIAGNOSTIC DATA (LABS, IMAGING, TESTING) - I reviewed patient records, labs, notes, testing and imaging myself where available.   ASSESSMENT AND PLAN  Samantha Crimesimothy W Dimperio is a 52 y.o. 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 Ph.D.  [facility] [address], [address] [place], [place] [address] Ph: [phone]) [phone] Fax: [phone]  CC: Remus LofflerJones, Angel S, PA-C

## 2017-08-03 ENCOUNTER — Telehealth: Payer: Self-pay | Admitting: Neurology

## 2017-08-03 NOTE — Telephone Encounter (Signed)
[employer] Auth: [id number] & [id number] (exp. 08/02/17 to 09/16/17). I left a voicemail for patient to call me back about scheduling.

## 2017-08-06 ENCOUNTER — Ambulatory Visit (INDEPENDENT_AMBULATORY_CARE_PROVIDER_SITE_OTHER): Payer: 59 | Admitting: Physician Assistant

## 2017-08-06 ENCOUNTER — Encounter: Payer: Self-pay | Admitting: Physician Assistant

## 2017-08-06 VITALS — BP 136/89 | HR 113 | Temp 97.9°F | Ht 72.0 in | Wt 150.0 lb

## 2017-08-06 DIAGNOSIS — Z716 Tobacco abuse counseling: Secondary | ICD-10-CM | POA: Diagnosis not present

## 2017-08-06 DIAGNOSIS — M5136 Other intervertebral disc degeneration, lumbar region: Secondary | ICD-10-CM

## 2017-08-06 DIAGNOSIS — M1990 Unspecified osteoarthritis, unspecified site: Secondary | ICD-10-CM

## 2017-08-06 DIAGNOSIS — I6930 Unspecified sequelae of cerebral infarction: Secondary | ICD-10-CM

## 2017-08-06 DIAGNOSIS — I639 Cerebral infarction, unspecified: Secondary | ICD-10-CM

## 2017-08-06 MED ORDER — NICOTINE 21 MG/24HR TD PT24: 21.0000 mg | MEDICATED_PATCH | Freq: Every day | TRANSDERMAL | 2 refills | Status: AC

## 2017-08-06 MED ORDER — HYDROCODONE-ACETAMINOPHEN 10-325 MG PO TABS: 1.0000 | ORAL_TABLET | Freq: Three times a day (TID) | ORAL | 0 refills | Status: AC | PRN

## 2017-08-06 MED ORDER — HYDROCODONE-ACETAMINOPHEN 10-325 MG PO TABS: 1.0000 | ORAL_TABLET | Freq: Four times a day (QID) | ORAL | 0 refills | Status: AC | PRN

## 2017-08-06 NOTE — Patient Instructions (Signed)
Your neurologist should be setting up an MRI of your brain and neck.  The response to be doing physical therapy.  So answer a phone call if you get one.  Continue take the medicines that have been changed.

## 2017-08-07 DIAGNOSIS — I639 Cerebral infarction, unspecified: Secondary | ICD-10-CM | POA: Insufficient documentation

## 2017-08-07 NOTE — Telephone Encounter (Signed)
I spoke to the patient wife August SaucerBennie who [place] the [place]. And informed her that the estimate cost would be about $939.59 and I informed her that we could do a payment plan.. She states she will think about it and get back to me.
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
## 2017-08-08 ENCOUNTER — Telehealth: Payer: Self-pay | Admitting: Physician Assistant

## 2017-08-08 NOTE — Telephone Encounter (Signed)
Please review and advise.

## 2017-09-10 ENCOUNTER — Other Ambulatory Visit: Payer: Self-pay | Admitting: Physician Assistant

## 2017-09-11 ENCOUNTER — Other Ambulatory Visit: Payer: Self-pay | Admitting: Physician Assistant

## 2017-11-01 ENCOUNTER — Ambulatory Visit: Payer: 59 | Admitting: Neurology

## 2017-11-01 ENCOUNTER — Other Ambulatory Visit: Payer: Self-pay | Admitting: Physician Assistant

## 2017-11-01 DIAGNOSIS — M1990 Unspecified osteoarthritis, unspecified site: Secondary | ICD-10-CM

## 2017-11-01 DIAGNOSIS — M5136 Other intervertebral disc degeneration, lumbar region: Secondary | ICD-10-CM

## 2017-11-26 DIAGNOSIS — Z0289 Encounter for other administrative examinations: Secondary | ICD-10-CM

## 2017-12-05 ENCOUNTER — Telehealth: Payer: Self-pay | Admitting: Neurology

## 2017-12-05 NOTE — Telephone Encounter (Signed)
Patient was seen one time in March 2019 and never completed the ordered tests, never scheduled a follow up and per his wife, has not stopped alcohol use.   The FMLA ppw we received was for her to miss work to take care of the patient when he is dizzy.  They were initially unable to afford the MRI and have not yet set up a payment plan.  Additionally, he was unwilling to complete the test.  She will discuss the need again with him.  For now, they will request the FMLA ppw to be reviewed by his PCP.  She is unsure if she can talk the patient into following up here again to complete his workup.  He has previously refused to come back in.

## 2017-12-05 NOTE — Telephone Encounter (Signed)
Pts wife August Saucer(Bennie) requesting a call stating she is unclear why there FMLA paperwork had been sent back with a message stating "we're unable to complete paperwork" Please call to advise

## 2018-01-12 ENCOUNTER — Emergency Department (HOSPITAL_COMMUNITY)
Admission: EM | Admit: 2018-01-12 | Discharge: 2018-01-12 | Disposition: A | Discharge status: Home / Self Care | Payer: 59 | Attending: Emergency Medicine | Admitting: Emergency Medicine

## 2018-01-12 ENCOUNTER — Emergency Department (HOSPITAL_COMMUNITY): Payer: 59

## 2018-01-12 ENCOUNTER — Encounter (HOSPITAL_COMMUNITY): Payer: Self-pay | Admitting: Emergency Medicine

## 2018-01-12 DIAGNOSIS — S79912A Unspecified injury of left hip, initial encounter: Secondary | ICD-10-CM | POA: Diagnosis present

## 2018-01-12 DIAGNOSIS — W010XXA Fall on same level from slipping, tripping and stumbling without subsequent striking against object, initial encounter: Secondary | ICD-10-CM | POA: Diagnosis not present

## 2018-01-12 DIAGNOSIS — F1020 Alcohol dependence, uncomplicated: Secondary | ICD-10-CM | POA: Diagnosis not present

## 2018-01-12 DIAGNOSIS — Z79899 Other long term (current) drug therapy: Secondary | ICD-10-CM | POA: Diagnosis not present

## 2018-01-12 DIAGNOSIS — Y9389 Activity, other specified: Secondary | ICD-10-CM | POA: Diagnosis not present

## 2018-01-12 DIAGNOSIS — F17210 Nicotine dependence, cigarettes, uncomplicated: Secondary | ICD-10-CM | POA: Insufficient documentation

## 2018-01-12 DIAGNOSIS — Y999 Unspecified external cause status: Secondary | ICD-10-CM | POA: Insufficient documentation

## 2018-01-12 DIAGNOSIS — S3210XA Unspecified fracture of sacrum, initial encounter for closed fracture: Secondary | ICD-10-CM | POA: Insufficient documentation

## 2018-01-12 DIAGNOSIS — Y92003 Bedroom of unspecified non-institutional (private) residence as the place of occurrence of the external cause: Secondary | ICD-10-CM | POA: Diagnosis not present

## 2018-01-12 DIAGNOSIS — Y909 Presence of alcohol in blood, level not specified: Secondary | ICD-10-CM | POA: Insufficient documentation

## 2018-01-12 DIAGNOSIS — Z7982 Long term (current) use of aspirin: Secondary | ICD-10-CM | POA: Insufficient documentation

## 2018-01-12 DIAGNOSIS — S322XXA Fracture of coccyx, initial encounter for closed fracture: Secondary | ICD-10-CM | POA: Insufficient documentation

## 2018-01-12 DIAGNOSIS — W19XXXA Unspecified fall, initial encounter: Secondary | ICD-10-CM

## 2018-01-12 MED ORDER — TRAMADOL HCL 50 MG PO TABS: 50.0000 mg | ORAL_TABLET | Freq: Four times a day (QID) | ORAL | 0 refills | Status: AC | PRN

## 2018-01-12 MED ORDER — LIDOCAINE 5 % EX PTCH: 1.0000 | MEDICATED_PATCH | CUTANEOUS | 0 refills | Status: AC

## 2018-01-12 NOTE — Discharge Instructions (Signed)
You were seen in the emergency department after fall with tailbone and low back pain with left hip pain.  X-rays show a stable fracture through the lower sacrum/upper coccyx region.  This is treated with pain control, bowel regimen and activity modification.  Take tramadol as prescribed for pain.  Start taking MiraLAX and stool softener to facilitate EC, soft bowel movements.  Avoid prolonged sitting or laying down positions as this will exacerbate the pain.  Sitting on a cushion and/or soft surfaces to avoid further injury or pain.  Follow-up with your primary care doctor in 2 to 3 days to ensure that your symptoms are well controlled and that no new symptoms have emerged.  Return to the ER for worsening pain, groin numbness, loss of bladder or bowel control, loss of sensation numbness or weakness of your extremities.

## 2018-01-12 NOTE — ED Triage Notes (Signed)
Patient here from home with complaints of fall today. Reports left side hip and leg pain. Also complains of lower back pain.

## 2018-01-12 NOTE — ED Provider Notes (Signed)
[facility]-EMERGENCY DEPT Provider Note   CSN: [id number] Arrival date & time: 01/12/18  1839     History   Chief Complaint Chief Complaint  Patient presents with  . Fall  . Hip Pain  . Leg Pain  . Back Pain    HPI Mitchell May is a 52 y.o. male with history of EtOH abuse, tobacco use, ataxia is here for evaluation of fall last night.  Patient remembers walking to go to his bedroom when he tripped and fell landing on his left hip/buttock on the concrete ground.  He endorses moderate, sharp, radiating pain from his left buttock, tailbone to his left hip and left knee.  Fall was unwitnessed.  Patient was able to get up on his own slowly.  He has taken aspirin without relief.  He drinks at least two 40 ounce beers daily and admits to drinking last night prior to fall.  States he drank his usual daily amount.  No anticoagulants.  He denies head trauma, LOC.  He denies associated headache, new vision changes, neck pain, chest pain, shortness of breath, abdominal pain, saddle anesthesia, bladder or bowel incontinence or retention, loss of sensation, paresthesias or weakness to his extremities.  His last alcoholic drink today was immediately PTA around 6 PM.  HPI  Past Medical History:  Diagnosis Date  . Alcoholism (HCC)   . GERD (gastroesophageal reflux disease)   . Heart murmur   . Seizures (HCC)    one >10 yrs ? reason  . Stroke [facility])     Patient Active Problem List   Diagnosis Date Noted  . Cerebrovascular accident (CVA) (HCC) 08/07/2017  . Gait abnormality 07/31/2017  . Lacunar infarction (HCC) 07/25/2017  . Diplopia 07/03/2017  . Arthritis 03/19/2017  . DDD (degenerative disc disease), lumbar 02/09/2016  . Rhinitis, allergic 02/09/2016  . Mucopurulent chronic bronchitis (HCC) 02/09/2016  . Erectile dysfunction 02/09/2016  . Alcohol abuse 02/09/2016  . Well adult exam 02/09/2016  . Tobacco use disorder 02/09/2016    Past Surgical History:    Procedure Laterality Date  . BACK SURGERY    . CERVICAL LAMINECTOMY          Home Medications    Prior to Admission medications   Medication Sig Start Date End Date Taking? Authorizing Provider  aspirin 325 MG tablet Take 325 mg by mouth daily.    [provider]  citalopram (CELEXA) 20 MG tablet Take 1 tablet (20 mg total) by mouth daily. 06/18/17   Remus Loffler, PA-C  dipyridamole-aspirin (AGGRENOX) 200-25 MG 12hr capsule Take 1 capsule by mouth 2 (two) times daily. 07/26/17   Mechele Claude, MD  fluticasone (FLOVENT HFA) 220 MCG/ACT inhaler Inhale 2 puffs into the lungs 2 (two) times daily. 03/19/17   Remus Loffler, PA-C  lidocaine (LIDODERM) 5 % Place 1 patch onto the skin daily for 5 days. Apply to tail bone. Change every 24 hours. 01/12/18 01/17/18  Liberty Handy, PA-C  nicotine (NICODERM CQ - DOSED IN MG/24 HOURS) 21 mg/24hr patch Place 1 patch (21 mg total) onto the skin daily. 08/06/17   Remus Loffler, PA-C  sildenafil (REVATIO) 20 MG tablet TAKE UP TO 4 TABLETS AS DIRECTED 30 MINUTES BEFORE INTERCOURSE 06/25/17   Remus Loffler, PA-C  thiamine (VITAMIN B-1) 100 MG tablet Take 1 tablet (100 mg total) by mouth daily. 07/31/17   Levert Feinstein, MD  traMADol (ULTRAM) 50 MG tablet Take 1 tablet (50 mg total) by mouth  every 6 (six) hours as needed for up to 3 days. 01/12/18 01/15/18  Liberty Handy, PA-C    Family History Family History  Problem Relation Age of Onset  . Stroke Mother   . Alzheimer's disease Father   . Throat cancer Brother   . Alzheimer's disease Paternal Grandmother   . Heart attack Brother     Social History Social History   Tobacco Use  . Smoking status: Current Every Day Smoker    Packs/day: 1.00    Years: 25.00    Pack years: 25.00    Types: Cigarettes  . Smokeless tobacco: Never Used  . Tobacco comment: 6pk - 12pk a day beer  Substance Use Topics  . Alcohol use: Yes    Comment: Two or more 40oz beers per day.  . Drug use: No      Allergies   Penicillins   Review of Systems Review of Systems  Musculoskeletal: Positive for arthralgias, gait problem and myalgias.  All other systems reviewed and are negative.    Physical Exam Updated Vital Signs BP (!) 129/91 (BP Location: Right Arm)   Pulse 81   Temp (!) 97.4 F (36.3 C) (Oral)   Resp 17   SpO2 97%   Physical Exam  Constitutional: He is oriented to person, place, and time. He appears well-developed and well-nourished. He is cooperative. He is easily aroused. No distress.  HENT:  Head: Atraumatic.  No abrasions, lacerations, deformity, defect, tenderness or crepitus of facial, nasal, scalp bones. No Raccoon's eyes. No Battle's sign. No hemotympanum or otorrhea, bilaterally. No epistaxis or rhinorrhea, septum midline.  No intraoral bleeding or injury. No malocclusion.   Eyes: Conjunctivae are normal.  Lids normal. EOMs and PERRL intact.   Neck:  C-spine: no midline or paraspinal muscular tenderness. Full active ROM of cervical spine w/o pain. Trachea midline  Cardiovascular: Normal rate, regular rhythm, S1 normal, S2 normal and normal heart sounds. Exam reveals no distant heart sounds.  Pulses:      Carotid pulses are 2+ on the right side, and 2+ on the left side.      Radial pulses are 2+ on the right side, and 2+ on the left side.       Dorsalis pedis pulses are 2+ on the right side, and 2+ on the left side.  2+ radial and DP pulses bilaterally  Pulmonary/Chest: Effort normal. He has no decreased breath sounds. He has rales.  Rhonchi worst at RLL. Faint expiratory wheezing diffusely, cleared with forceful cough. No anterior/posterior thorax tenderness. Equal and symmetric chest wall expansion   Abdominal: Soft.  Abdomen is NTND. No guarding. No seatbelt sign.   Musculoskeletal: Normal range of motion. He exhibits tenderness. He exhibits no deformity.  T-spine: no paraspinal muscular tenderness or midline tenderness.    L-spine: no midline L  spine or paraspinal muscular or midline tenderness.  TTP to very low sacrum with ecchymosis.    Pelvis: no instability with AP/L compression, leg shortening or rotation. Diffuse, non focal left buttock tenderness. Full PROM of bilateral hips, pain with deep L hip flexion without crepitus. Pt able to sit up, stand up and get in/out of bed without assist or significant discomfort.   Neurological: He is alert, oriented to person, place, and time and easily aroused.  Speech is fluent without obvious dysarthria or dysphasia. Strength 5/5 with hand grip and ankle F/E.   Sensation to light touch intact in hands and feet. Normal gait. No pronator drift. No leg  drop.  Normal finger-to-nose and finger tapping.  CN I, II and VIII not tested. CN II-XII grossly intact bilaterally.   Skin: Skin is warm and dry. Capillary refill takes less than 2 seconds.  Psychiatric: His behavior is normal. Thought content normal.     ED Treatments / Results  Labs (all labs ordered are listed, but only abnormal results are displayed) Labs Reviewed - No data to display  EKG None  Radiology Dg Chest 2 View  Result Date: 01/12/2018 CLINICAL DATA:  Fall today.  Current smoker. EXAM: CHEST - 2 VIEW COMPARISON:  Chest x-ray dated 07/03/2017. FINDINGS: Heart size and mediastinal contours are within normal limits. Lungs are clear. No pleural effusion or pneumothorax seen. Osseous structures about the chest are unremarkable. IMPRESSION: No active cardiopulmonary disease. Electronically Signed   By: Bary RichardStan  Maynard M.D.   On: 01/12/2018 20:29   Dg Sacrum/coccyx  Result Date: 01/12/2018 CLINICAL DATA:  Fall EXAM: SACRUM AND COCCYX - 2+ VIEW COMPARISON:  None FINDINGS: Prior posterior fusion at L5-S1. There appears to be a fracture through the lower sacrum/upper coccyx region best seen on the lateral view. No additional acute bony abnormality. IMPRESSION: Fracture through the lower sacrum/upper coccyx region. This is seen on the  lateral view only. Electronically Signed   By: Charlett NoseKevin  Dover M.D.   On: 01/12/2018 20:28   Ct Head Wo Contrast  Result Date: 01/12/2018 CLINICAL DATA:  Unwitnessed fall. EXAM: CT HEAD WITHOUT CONTRAST TECHNIQUE: Contiguous axial images were obtained from the base of the skull through the vertex without intravenous contrast. COMPARISON:  07/24/2017 FINDINGS: Brain: There is atrophy and chronic small vessel disease changes. No acute intracranial abnormality. Specifically, no hemorrhage, hydrocephalus, mass lesion, acute infarction, or significant intracranial injury. Vascular: No hyperdense vessel or unexpected calcification. Skull: No acute calvarial abnormality. Sinuses/Orbits: Visualized paranasal sinuses and mastoids clear. Orbital soft tissues unremarkable. Other: None IMPRESSION: No acute intracranial abnormality. Atrophy, chronic microvascular disease. Electronically Signed   By: Charlett NoseKevin  Dover M.D.   On: 01/12/2018 20:16   Dg Knee Complete 4 Views Left  Result Date: 01/12/2018 CLINICAL DATA:  Patient here from home with complaints of fall today. Reports left side hip and leg pain. EXAM: LEFT KNEE - COMPLETE 4+ VIEW COMPARISON:  None. FINDINGS: No evidence of fracture, dislocation, or joint effusion. No evidence of arthropathy or other focal bone abnormality. Soft tissues are unremarkable. IMPRESSION: Negative. Electronically Signed   By: Bary RichardStan  Maynard M.D.   On: 01/12/2018 20:28   Dg Hip Unilat With Pelvis 2-3 Views Left  Result Date: 01/12/2018 CLINICAL DATA:  Left side hip pain and leg pain. EXAM: DG HIP (WITH OR WITHOUT PELVIS) 2-3V LEFT COMPARISON:  None. FINDINGS: Early degenerative changes in the hips bilaterally with joint space narrowing. SI joints are symmetric and unremarkable. No acute bony abnormality. Specifically, no fracture, subluxation, or dislocation. IMPRESSION: Early degenerative changes.  No acute bony abnormality. Electronically Signed   By: Charlett NoseKevin  Dover M.D.   On: 01/12/2018  19:21    Procedures Procedures (including critical care time)  Medications Ordered in ED Medications - No data to display   Initial Impression / Assessment and Plan / ED Course  I have reviewed the triage vital signs and the nursing notes.  Pertinent labs & imaging results that were available during my care of the patient were reviewed by me and considered in my medical decision making (see chart for details).  Clinical Course as of Jan 12 2102  Sat Jan 12, 2018  2047 IMPRESSION: Fracture through the lower sacrum/upper coccyx region. This is seen on the lateral view only.    DG Sacrum/Coccyx [CG]    Clinical Course User Index [CG] Jerrell Mylar   Suspect MSK vs osseous injury given trauma and exam findings.  However, he has no midline CTL spine tenderness. He has focal tenderness and ecchymosis at low sacrum.  X-rays confirm sacral/coccyx fx.  Otherwise, pt has been ambulatory in ER without pain control.  No signs of cauda equina.  Given ETOH abuse and unwitnessed fall, head CT ordered which was negative.  He is neuro intact, without HA.  No signs of head trauma.  Noted RLL rhonchi and wheezing however CXR is negative, likely from long term tobacco use. He denies infectious symptoms, SOB, CP, increase or changes in sputum production making COPD exacerbation or PNA unlikely.  Given reassuring work up and exam will dc with symptomatic control.  Given h/o ETOH abuse he is at risk for liver injury and GI bleed, so will opt for tramadol and lidocaine patches for pain control. Discussed return precautions. Pt and family at bedside in agreement.   Chart and available pertinent old records, if available, reviewed by me. Imaging and labs in ER viewed and interpreted by me and used in the medical decision making with formal interpretation from radiologist. Discharge home in stable condition, return precautions discussed.  Patient agreeable with plan for discharge home.   Final  Clinical Impressions(s) / ED Diagnoses   Final diagnoses:  Fall, initial encounter  Closed fracture of sacrum and coccyx, initial encounter [facility])    ED Discharge Orders         Ordered    traMADol (ULTRAM) 50 MG tablet  Every 6 hours PRN     01/12/18 2103    lidocaine (LIDODERM) 5 %  Every 24 hours     01/12/18 2103           Liberty Handy, PA-C 01/13/18 1610    Vanetta Mulders, MD 01/20/18 [id number]

## 2018-02-11 ENCOUNTER — Ambulatory Visit: Payer: 59 | Admitting: Neurology

## 2018-02-11 ENCOUNTER — Telehealth: Payer: Self-pay | Admitting: *Deleted

## 2018-02-11 NOTE — Telephone Encounter (Signed)
No showed follow up appointment. 

## 2018-02-13 ENCOUNTER — Encounter: Payer: Self-pay | Admitting: Neurology

## 2018-06-19 ENCOUNTER — Other Ambulatory Visit: Payer: Self-pay

## 2018-06-19 ENCOUNTER — Encounter (HOSPITAL_COMMUNITY): Payer: Self-pay | Admitting: Emergency Medicine

## 2018-06-19 ENCOUNTER — Emergency Department (HOSPITAL_COMMUNITY): Payer: Self-pay

## 2018-06-19 ENCOUNTER — Emergency Department (HOSPITAL_COMMUNITY)
Admission: EM | Admit: 2018-06-19 | Discharge: 2018-06-19 | Disposition: A | Discharge status: Home / Self Care | Payer: Self-pay | Attending: Emergency Medicine | Admitting: Emergency Medicine

## 2018-06-19 DIAGNOSIS — S32591A Other specified fracture of right pubis, initial encounter for closed fracture: Secondary | ICD-10-CM | POA: Insufficient documentation

## 2018-06-19 DIAGNOSIS — F17210 Nicotine dependence, cigarettes, uncomplicated: Secondary | ICD-10-CM | POA: Insufficient documentation

## 2018-06-19 DIAGNOSIS — Z79899 Other long term (current) drug therapy: Secondary | ICD-10-CM | POA: Insufficient documentation

## 2018-06-19 DIAGNOSIS — Z7982 Long term (current) use of aspirin: Secondary | ICD-10-CM | POA: Insufficient documentation

## 2018-06-19 DIAGNOSIS — Y9301 Activity, walking, marching and hiking: Secondary | ICD-10-CM | POA: Insufficient documentation

## 2018-06-19 DIAGNOSIS — W010XXA Fall on same level from slipping, tripping and stumbling without subsequent striking against object, initial encounter: Secondary | ICD-10-CM | POA: Insufficient documentation

## 2018-06-19 DIAGNOSIS — Y999 Unspecified external cause status: Secondary | ICD-10-CM | POA: Insufficient documentation

## 2018-06-19 DIAGNOSIS — Y929 Unspecified place or not applicable: Secondary | ICD-10-CM | POA: Insufficient documentation

## 2018-06-19 MED ORDER — TRAMADOL HCL 50 MG PO TABS: ORAL_TABLET | ORAL | 0 refills | Status: AC

## 2018-06-19 MED ORDER — HYDROCODONE-ACETAMINOPHEN 5-325 MG PO TABS: 1.0000 | ORAL_TABLET | ORAL | 0 refills | Status: DC | PRN

## 2018-06-19 NOTE — Discharge Instructions (Addendum)
Your x-ray reveals a fracture of the pelvic ramus on the right.  It is important that you see Dr. Romeo Apple, or the orthopedic specialist of your choice to have this reevaluated.  Please rest your hip is much as possible.  Use Tylenol extra strength every 4 hours for mild pain.  Please use hydrocodone for more severe pain.  Return to the emergency department if any changes in your condition, problems, or concerns.

## 2018-06-19 NOTE — ED Triage Notes (Addendum)
Patient states he fell 5 days ago and is complaining of continuing pain to right hip radiating into right upper leg. Patient ambulatory with walker at triage.

## 2018-06-19 NOTE — ED Notes (Signed)
Pt transported to Xray. 

## 2018-06-19 NOTE — ED Provider Notes (Signed)
[facility] EMERGENCY DEPARTMENT Provider Note   CSN: [id number] Arrival date & time: 06/19/18  1132     History   Chief Complaint Chief Complaint  Patient presents with  . Fall    HPI Mitchell May is a 53 y.o. male.  Patient is a 53 year old male who presents to the emergency department with a complaint of right hip area pain.  The patient states that 5 days ago he slipped on carpet and fell on his right side injuring the hip.  He says he has been having some pain since that time.  Conservative measures have not helped with his pain and discomfort.  The pain seems to radiate into the right upper thigh area.  Patient has pain when he attempts to lift his leg.  He has been using a walker since the fall.  No recent operations or procedures involving the right hip or pelvis.  Patient has had back surgery due to degenerative disc disease in the past.  He presents now for assistance with this issue.  The history is provided by the patient.  Fall  Pertinent negatives include no chest pain, no abdominal pain and no shortness of breath.    Past Medical History:  Diagnosis Date  . Alcoholism (HCC)   . GERD (gastroesophageal reflux disease)   . Heart murmur   . Seizures (HCC)    one >10 yrs ? reason  . Stroke [facility])     Patient Active Problem List   Diagnosis Date Noted  . Cerebrovascular accident (CVA) (HCC) 08/07/2017  . Gait abnormality 07/31/2017  . Lacunar infarction (HCC) 07/25/2017  . Diplopia 07/03/2017  . Arthritis 03/19/2017  . DDD (degenerative disc disease), lumbar 02/09/2016  . Rhinitis, allergic 02/09/2016  . Mucopurulent chronic bronchitis (HCC) 02/09/2016  . Erectile dysfunction 02/09/2016  . Alcohol abuse 02/09/2016  . Well adult exam 02/09/2016  . Tobacco use disorder 02/09/2016    Past Surgical History:  Procedure Laterality Date  . BACK SURGERY    . CERVICAL LAMINECTOMY          Home Medications    Prior to Admission medications     Medication Sig Start Date End Date Taking? Authorizing Provider  aspirin 325 MG tablet Take 325 mg by mouth daily.    [provider]  citalopram (CELEXA) 20 MG tablet Take 1 tablet (20 mg total) by mouth daily. 06/18/17   Remus Loffler, PA-C  dipyridamole-aspirin (AGGRENOX) 200-25 MG 12hr capsule Take 1 capsule by mouth 2 (two) times daily. 07/26/17   Mechele Claude, MD  fluticasone (FLOVENT HFA) 220 MCG/ACT inhaler Inhale 2 puffs into the lungs 2 (two) times daily. 03/19/17   Remus Loffler, PA-C  nicotine (NICODERM CQ - DOSED IN MG/24 HOURS) 21 mg/24hr patch Place 1 patch (21 mg total) onto the skin daily. 08/06/17   Remus Loffler, PA-C  sildenafil (REVATIO) 20 MG tablet TAKE UP TO 4 TABLETS AS DIRECTED 30 MINUTES BEFORE INTERCOURSE 06/25/17   Remus Loffler, PA-C  thiamine (VITAMIN B-1) 100 MG tablet Take 1 tablet (100 mg total) by mouth daily. 07/31/17   Levert Feinstein, MD    Family History Family History  Problem Relation Age of Onset  . Stroke Mother   . Alzheimer's disease Father   . Throat cancer Brother   . Alzheimer's disease Paternal Grandmother   . Heart attack Brother     Social History Social History   Tobacco Use  . Smoking status: Current Every Day Smoker  Packs/day: 1.00    Years: 25.00    Pack years: 25.00    Types: Cigarettes  . Smokeless tobacco: Never Used  . Tobacco comment: 6pk - 12pk a day beer  Substance Use Topics  . Alcohol use: Yes    Comment: Two or more 40oz beers per day.  . Drug use: No     Allergies   Penicillins   Review of Systems Review of Systems  Constitutional: Negative for activity change.       All ROS Neg except as noted in HPI  HENT: Negative for nosebleeds.   Eyes: Negative for photophobia and discharge.  Respiratory: Negative for cough, shortness of breath and wheezing.   Cardiovascular: Negative for chest pain and palpitations.  Gastrointestinal: Negative for abdominal pain and blood in stool.  Genitourinary:  Negative for dysuria, frequency and hematuria.  Musculoskeletal: Positive for arthralgias and back pain. Negative for neck pain.  Skin: Negative.   Neurological: Negative for dizziness, seizures and speech difficulty.  Psychiatric/Behavioral: Negative for confusion and hallucinations.     Physical Exam Updated Vital Signs BP (!) 128/100 (BP Location: Right Arm)   Pulse (!) 121   Temp 98 F (36.7 C) (Oral)   Resp 18   Ht 6' (1.829 m)   Wt 68 kg   SpO2 99%   BMI 20.34 kg/m   Physical Exam Vitals signs and nursing note reviewed.  Constitutional:      Appearance: He is well-developed. He is not toxic-appearing.  HENT:     Head: Normocephalic.     Right Ear: Tympanic membrane and external ear normal.     Left Ear: Tympanic membrane and external ear normal.  Eyes:     General: Lids are normal.     Pupils: Pupils are equal, round, and reactive to light.  Neck:     Musculoskeletal: Normal range of motion and neck supple.     Vascular: No carotid bruit.  Cardiovascular:     Rate and Rhythm: Normal rate and regular rhythm.     Pulses: Normal pulses.     Heart sounds: Normal heart sounds.  Pulmonary:     Effort: No respiratory distress.     Breath sounds: Normal breath sounds.  Abdominal:     General: Bowel sounds are normal.     Palpations: Abdomen is soft.     Tenderness: There is no abdominal tenderness. There is no guarding.  Musculoskeletal: Normal range of motion.     Comments: There is pain with attempted range of motion of the right hip.  There is no shortening appreciated of the lower extremities.  There is pain to palpation over the anterior pelvis area extending to the right hip.  There is no palpable deformity appreciated.  No hot joints appreciated.  There is good range of motion of the right knee, ankle, and toes.  The posterior dorsalis pedis and posterior tibial pulses are 2+.  Lymphadenopathy:     Head:     Right side of head: No submandibular adenopathy.      Left side of head: No submandibular adenopathy.     Cervical: No cervical adenopathy.  Skin:    General: Skin is warm and dry.  Neurological:     Mental Status: He is alert and oriented to person, place, and time.     Cranial Nerves: No cranial nerve deficit.     Sensory: No sensory deficit.  Psychiatric:        Speech: Speech normal.  ED Treatments / Results  Labs (all labs ordered are listed, but only abnormal results are displayed) Labs Reviewed - No data to display  EKG None  Radiology Dg Hip Unilat W Or Wo Pelvis 2-3 Views Right  Result Date: 06/19/2018 CLINICAL DATA:  Fall 5 days ago.  Pain. EXAM: DG HIP (WITH OR WITHOUT PELVIS) 2-3V RIGHT COMPARISON:  01/12/2018. FINDINGS: Prior lumbosacral spine fusion. Hardware intact. Diffuse osteopenia. Fracture of the midportion of right superior pubic ramus noted. Fracture of the medial portion of the right inferior pubic ramus noted. Mild displacement. Right femur is intact. IMPRESSION: Right superior inferior pubic rami fractures. Mild displacement. Right femur is intact. Electronically Signed   By: Maisie Fus  Register   On: 06/19/2018 14:22    Procedures Procedures (including critical care time)  Medications Ordered in ED Medications - No data to display   Initial Impression / Assessment and Plan / ED Course  I have reviewed the triage vital signs and the nursing notes.  Pertinent labs & imaging results that were available during my care of the patient were reviewed by me and considered in my medical decision making (see chart for details).       Final Clinical Impressions(s) / ED Diagnoses MDM  Blood pressure is elevated at 128/100.  Pulse rate is elevated at 121.  The pulse oximetry is well within normal limits at 99%.  There are no gross neurologic deficits appreciated, and there are no vascular deficits appreciated.  The pain is reproduced with having the patient stand and walk, also reproduced with palpation over  the anterior pubis area.  X-ray shows a right superior inferior pubic ramus fracture.  I have discussed the fracture with the patient in terms of which he understands.  Patient will be treated with medication for pain and discomfort.  Patient is referred to Dr. Romeo Apple for orthopedic evaluation.  Patient already has a walker to use.  He will return sooner if any changes in his condition, problems, or concerns.   Final diagnoses:  Inferior pubic ramus fracture, right, closed, initial encounter [facility])    ED Discharge Orders         Ordered    HYDROcodone-acetaminophen (NORCO/VICODIN) 5-325 MG tablet  Every 4 hours PRN     06/19/18 1513           Ivery Quale, PA-C 06/19/18 1519    Linwood Dibbles, MD 06/19/18 1549

## 2019-01-24 ENCOUNTER — Other Ambulatory Visit: Payer: Self-pay

## 2019-01-24 ENCOUNTER — Encounter (HOSPITAL_BASED_OUTPATIENT_CLINIC_OR_DEPARTMENT_OTHER): Payer: Self-pay | Admitting: *Deleted

## 2019-01-24 ENCOUNTER — Emergency Department (HOSPITAL_BASED_OUTPATIENT_CLINIC_OR_DEPARTMENT_OTHER)
Admission: EM | Admit: 2019-01-24 | Discharge: 2019-01-24 | Disposition: A | Discharge status: Home / Self Care | Payer: Self-pay | Attending: Emergency Medicine | Admitting: Emergency Medicine

## 2019-01-24 ENCOUNTER — Emergency Department (HOSPITAL_BASED_OUTPATIENT_CLINIC_OR_DEPARTMENT_OTHER): Payer: Self-pay

## 2019-01-24 DIAGNOSIS — Y929 Unspecified place or not applicable: Secondary | ICD-10-CM | POA: Insufficient documentation

## 2019-01-24 DIAGNOSIS — Y939 Activity, unspecified: Secondary | ICD-10-CM | POA: Insufficient documentation

## 2019-01-24 DIAGNOSIS — W19XXXA Unspecified fall, initial encounter: Secondary | ICD-10-CM

## 2019-01-24 DIAGNOSIS — Z7982 Long term (current) use of aspirin: Secondary | ICD-10-CM | POA: Insufficient documentation

## 2019-01-24 DIAGNOSIS — Y999 Unspecified external cause status: Secondary | ICD-10-CM | POA: Insufficient documentation

## 2019-01-24 DIAGNOSIS — W010XXA Fall on same level from slipping, tripping and stumbling without subsequent striking against object, initial encounter: Secondary | ICD-10-CM | POA: Insufficient documentation

## 2019-01-24 DIAGNOSIS — F17210 Nicotine dependence, cigarettes, uncomplicated: Secondary | ICD-10-CM | POA: Insufficient documentation

## 2019-01-24 DIAGNOSIS — S0001XA Abrasion of scalp, initial encounter: Secondary | ICD-10-CM | POA: Insufficient documentation

## 2019-01-24 DIAGNOSIS — Z8673 Personal history of transient ischemic attack (TIA), and cerebral infarction without residual deficits: Secondary | ICD-10-CM | POA: Insufficient documentation

## 2019-01-24 DIAGNOSIS — S32000A Wedge compression fracture of unspecified lumbar vertebra, initial encounter for closed fracture: Secondary | ICD-10-CM | POA: Insufficient documentation

## 2019-01-24 DIAGNOSIS — Z79899 Other long term (current) drug therapy: Secondary | ICD-10-CM | POA: Insufficient documentation

## 2019-01-24 MED ORDER — IBUPROFEN 800 MG PO TABS
800.0000 mg | ORAL_TABLET | Freq: Once | ORAL | Status: AC
  Administered 2019-01-24: 15:00:00 800 mg via ORAL
  Filled 2019-01-24: qty 1

## 2019-01-24 NOTE — ED Triage Notes (Signed)
He tripped over a bed sheet in his house and fell 3 days ago. Pain in his arms and shoulders and a bruise to the back of his head. No LOC.

## 2019-01-24 NOTE — ED Notes (Signed)
ED Provider at bedside. 

## 2019-01-24 NOTE — ED Notes (Signed)
Family at bedside. 

## 2019-01-24 NOTE — Discharge Instructions (Signed)
Contact a health care provider if: °You have a fever. °You develop a cough that makes your pain worse. °Your pain medicine is not helping. °Your pain does not get better over time. °You cannot return to your normal activities as planned or expected. °Get help right away if: °Your pain is very bad and it suddenly gets worse. °You are unable to move any body part (paralysis) that is below the level of your injury. °You have numbness, tingling, or weakness in any body part that is below the level of your injury. °You cannot control your bladder or bowels. °

## 2019-01-24 NOTE — ED Notes (Signed)
Patient transported to X-ray 

## 2019-01-24 NOTE — ED Provider Notes (Signed)
[facility] EMERGENCY DEPARTMENT Provider Note   CSN: [id number] Arrival date & time: 01/24/19  1312     History   Chief Complaint Chief Complaint  Patient presents with  . Fall  . Head Injury    HPI Mitchell May is a 53 y.o. male with a past medical history of alcohol abuse and dependence.  He is accompanied by his estranged wife who states that he needed to come in because he fell a couple days ago and hit his head.  Patient states that he tripped and fell and hit his head but he cannot remember if it was Monday or Tuesday of this week.  He states that he falls frequently and attributes this to some "mini strokes."  He states that sometimes his knees just give out.  His wife states that he drinks at least 240 ounces daily but probably much more and that he has poor nutritional intake.  Patient denies any numbness or tingling in the feet but does admit to falling frequently.  He has had a pubic ramus fracture back in January of this year.  Patient denies any significant pain except for some tenderness in his shoulders.  He has some abrasions to the head which have been cleaned up prior to my visualization.  He is otherwise at his baseline mental status.  He is tearful during the examination.     HPI  Past Medical History:  Diagnosis Date  . Alcoholism (HCC)   . GERD (gastroesophageal reflux disease)   . Heart murmur   . Seizures (HCC)    one >10 yrs ? reason  . Stroke [facility])     Patient Active Problem List   Diagnosis Date Noted  . Cerebrovascular accident (CVA) (HCC) 08/07/2017  . Gait abnormality 07/31/2017  . Lacunar infarction (HCC) 07/25/2017  . Diplopia 07/03/2017  . Arthritis 03/19/2017  . DDD (degenerative disc disease), lumbar 02/09/2016  . Rhinitis, allergic 02/09/2016  . Mucopurulent chronic bronchitis (HCC) 02/09/2016  . Erectile dysfunction 02/09/2016  . Alcohol abuse 02/09/2016  . Well adult exam 02/09/2016  . Tobacco use disorder 02/09/2016    Past Surgical History:  Procedure Laterality Date  . BACK SURGERY    . CERVICAL LAMINECTOMY          Home Medications    Prior to Admission medications   Medication Sig Start Date End Date Taking? Authorizing Provider  aspirin 325 MG tablet Take 325 mg by mouth daily.   Yes [provider]  citalopram (CELEXA) 20 MG tablet Take 1 tablet (20 mg total) by mouth daily. 06/18/17   Remus Loffler, PA-C  dipyridamole-aspirin (AGGRENOX) 200-25 MG 12hr capsule Take 1 capsule by mouth 2 (two) times daily. 07/26/17   Mechele Claude, MD  fluticasone (FLOVENT HFA) 220 MCG/ACT inhaler Inhale 2 puffs into the lungs 2 (two) times daily. 03/19/17   Remus Loffler, PA-C  nicotine (NICODERM CQ - DOSED IN MG/24 HOURS) 21 mg/24hr patch Place 1 patch (21 mg total) onto the skin daily. 08/06/17   Remus Loffler, PA-C  sildenafil (REVATIO) 20 MG tablet TAKE UP TO 4 TABLETS AS DIRECTED 30 MINUTES BEFORE INTERCOURSE 06/25/17   Remus Loffler, PA-C  thiamine (VITAMIN B-1) 100 MG tablet Take 1 tablet (100 mg total) by mouth daily. 07/31/17   Levert Feinstein, MD  traMADol Janean Sark) 50 MG tablet 1 or 2 po q6h prn pain 06/19/18   Ivery Quale, PA-C    Family History Family History  Problem  Relation Age of Onset  . Stroke Mother   . Alzheimer's disease Father   . Throat cancer Brother   . Alzheimer's disease Paternal Grandmother   . Heart attack Brother     Social History Social History   Tobacco Use  . Smoking status: Current Every Day Smoker    Packs/day: 1.00    Years: 25.00    Pack years: 25.00    Types: Cigarettes  . Smokeless tobacco: Never Used  . Tobacco comment: 6pk - 12pk a day beer  Substance Use Topics  . Alcohol use: Yes    Comment: Two or more 40oz beers per day.  . Drug use: No     Allergies   Penicillins and Tylenol [acetaminophen]   Review of Systems Review of Systems Ten systems reviewed and are negative for acute change, except as noted in the HPI.    Physical Exam  Updated Vital Signs BP (!) 132/101   Pulse (!) 104   Temp 98.2 F (36.8 C) (Oral)   Resp 18   Ht 6\' 1"  (1.854 m)   Wt 67.6 kg   SpO2 97%   BMI 19.66 kg/m   Physical Exam Vitals signs and nursing note reviewed.  Constitutional:      General: He is not in acute distress.    Appearance: He is well-developed. He is not diaphoretic.     Comments: Patient appears much older than stated age  HENT:     Head: Normocephalic.     Comments: Abrasions to the left occipital and left parietal region Eyes:     General: No scleral icterus.    Conjunctiva/sclera: Conjunctivae normal.  Neck:     Musculoskeletal: Normal range of motion and neck supple.  Cardiovascular:     Rate and Rhythm: Normal rate and regular rhythm.     Heart sounds: Normal heart sounds.  Pulmonary:     Effort: Pulmonary effort is normal. No respiratory distress.     Breath sounds: Normal breath sounds.  Abdominal:     Palpations: Abdomen is soft.     Tenderness: There is no abdominal tenderness.  Musculoskeletal:     Comments: No midline spinal tenderness  Skin:    General: Skin is warm and dry.  Neurological:     Mental Status: He is alert and oriented to person, place, and time.  Psychiatric:        Behavior: Behavior normal.      ED Treatments / Results  Labs (all labs ordered are listed, but only abnormal results are displayed) Labs Reviewed - No data to display  EKG None  Radiology No results found.  Procedures Procedures (including critical care time)  Medications Ordered in ED Medications - No data to display   Initial Impression / Assessment and Plan / ED Course  I have reviewed the triage vital signs and the nursing notes.  Pertinent labs & imaging results that were available during my care of the patient were reviewed by me and considered in my medical decision making (see chart for details).        53 year old gentleman with history of alcohol abuse and dependence.  He has  frequent falls at home.  Patient initial imaging of the CT head, C-spine and plain view of the thoracic spine without acute abnormality however there was an age indeterminant lumbar/T12 fracture.  Follow-up CT imaging of the lumbar spine shows multiple age-indeterminate compression fractures.  I discussed the findings with the patient and his wife who are  at bedside.  Patient states that he sometimes has back pain but does not have any significant back pain.  He exhibits no leg weakness on examination.  Feel patient is safe to follow-up in the outpatient setting.  Have given him a printout of his CT scan.  Patient is up-to-date on his tetanus vaccination appears appropriate for discharge at this time Final Clinical Impressions(s) / ED Diagnoses   Final diagnoses:  None    ED Discharge Orders    None       Margarita Mail, PA-C 01/24/19 1613    Maudie Flakes, MD 01/25/19 1501

## 2019-01-24 NOTE — ED Notes (Signed)
Patient transported to CT 

## 2019-01-24 NOTE — ED Notes (Signed)
Attempted to discuss pt's ETOH abuse during d/c, but pt denies having any problem at this point.

## 2019-01-24 NOTE — ED Notes (Signed)
Pt on monitor 

## 2019-09-23 IMAGING — CR DG SACRUM/COCCYX 2+V
3 series · 3 of 3 positions shown · non-contrast
Comparison: None

CLINICAL DATA: Fall

EXAM:
SACRUM AND COCCYX - 2+ VIEW

[t sacrum ap]
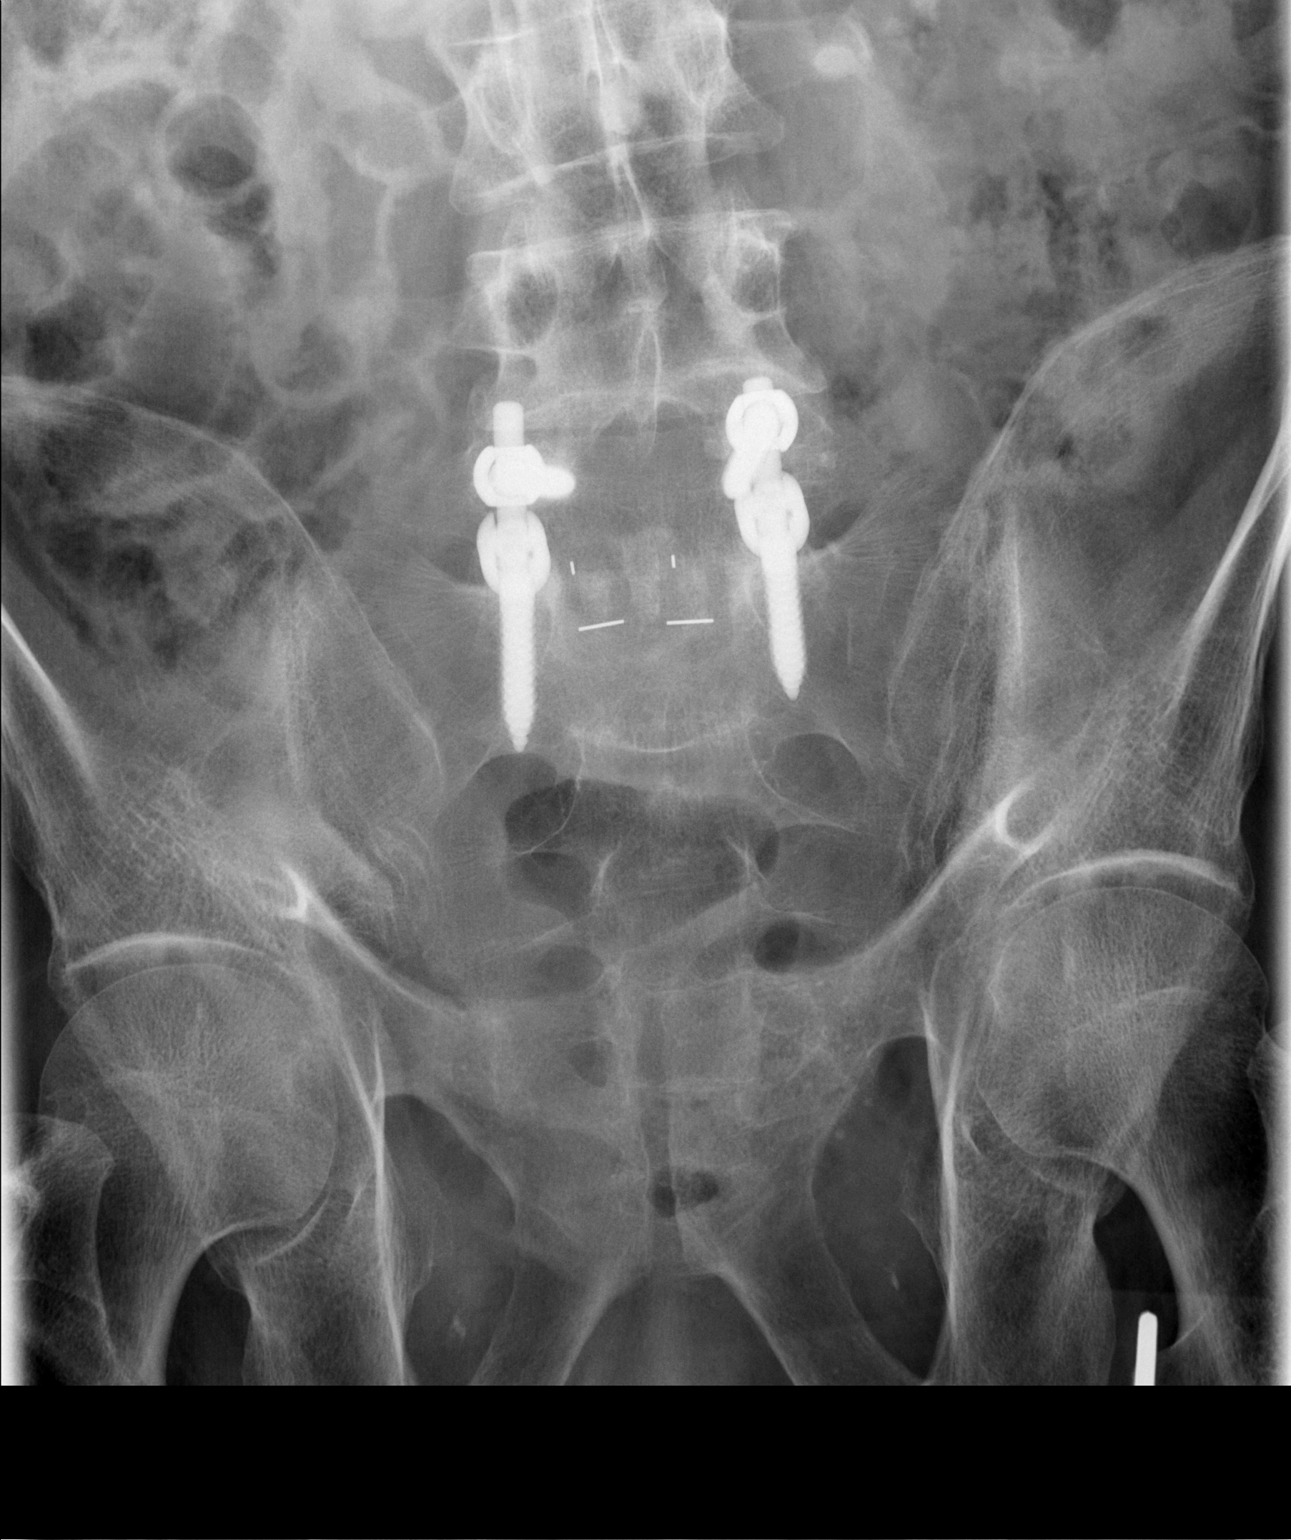

[t coccyx ap]
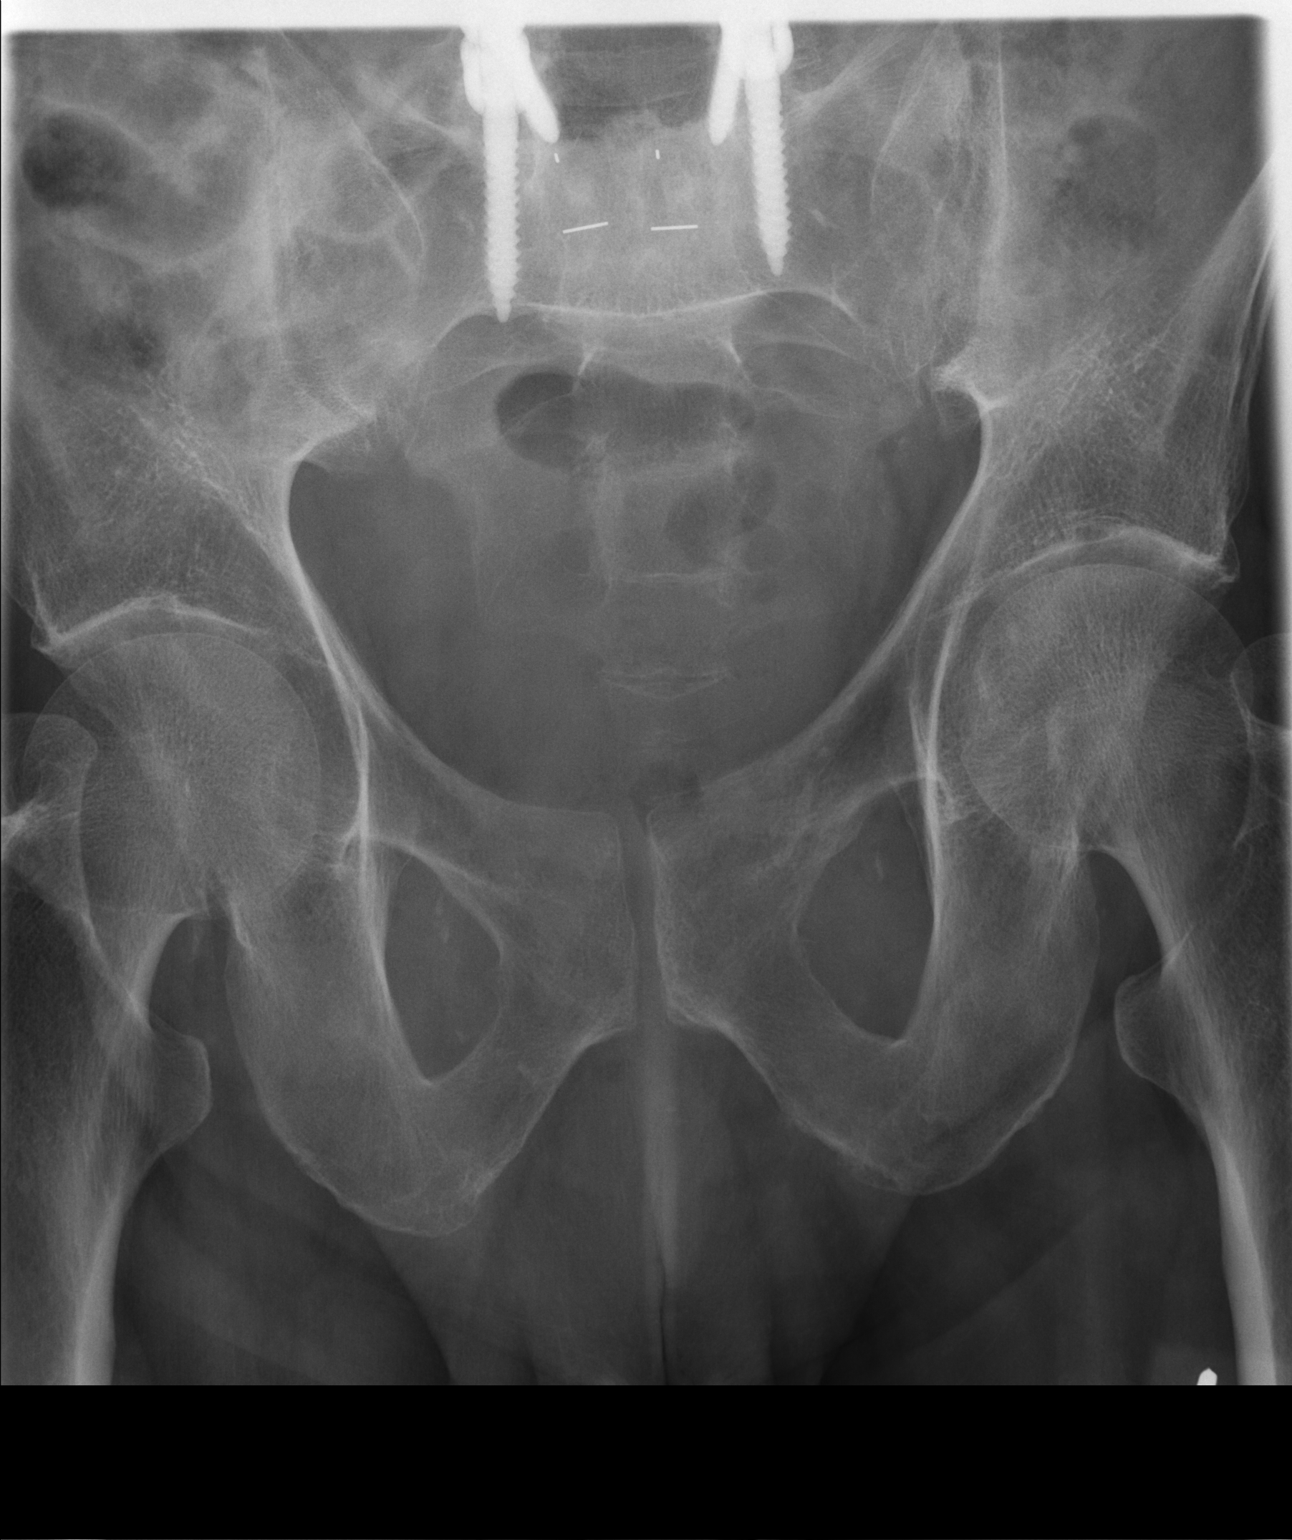

[t sacrum coccyx lat]
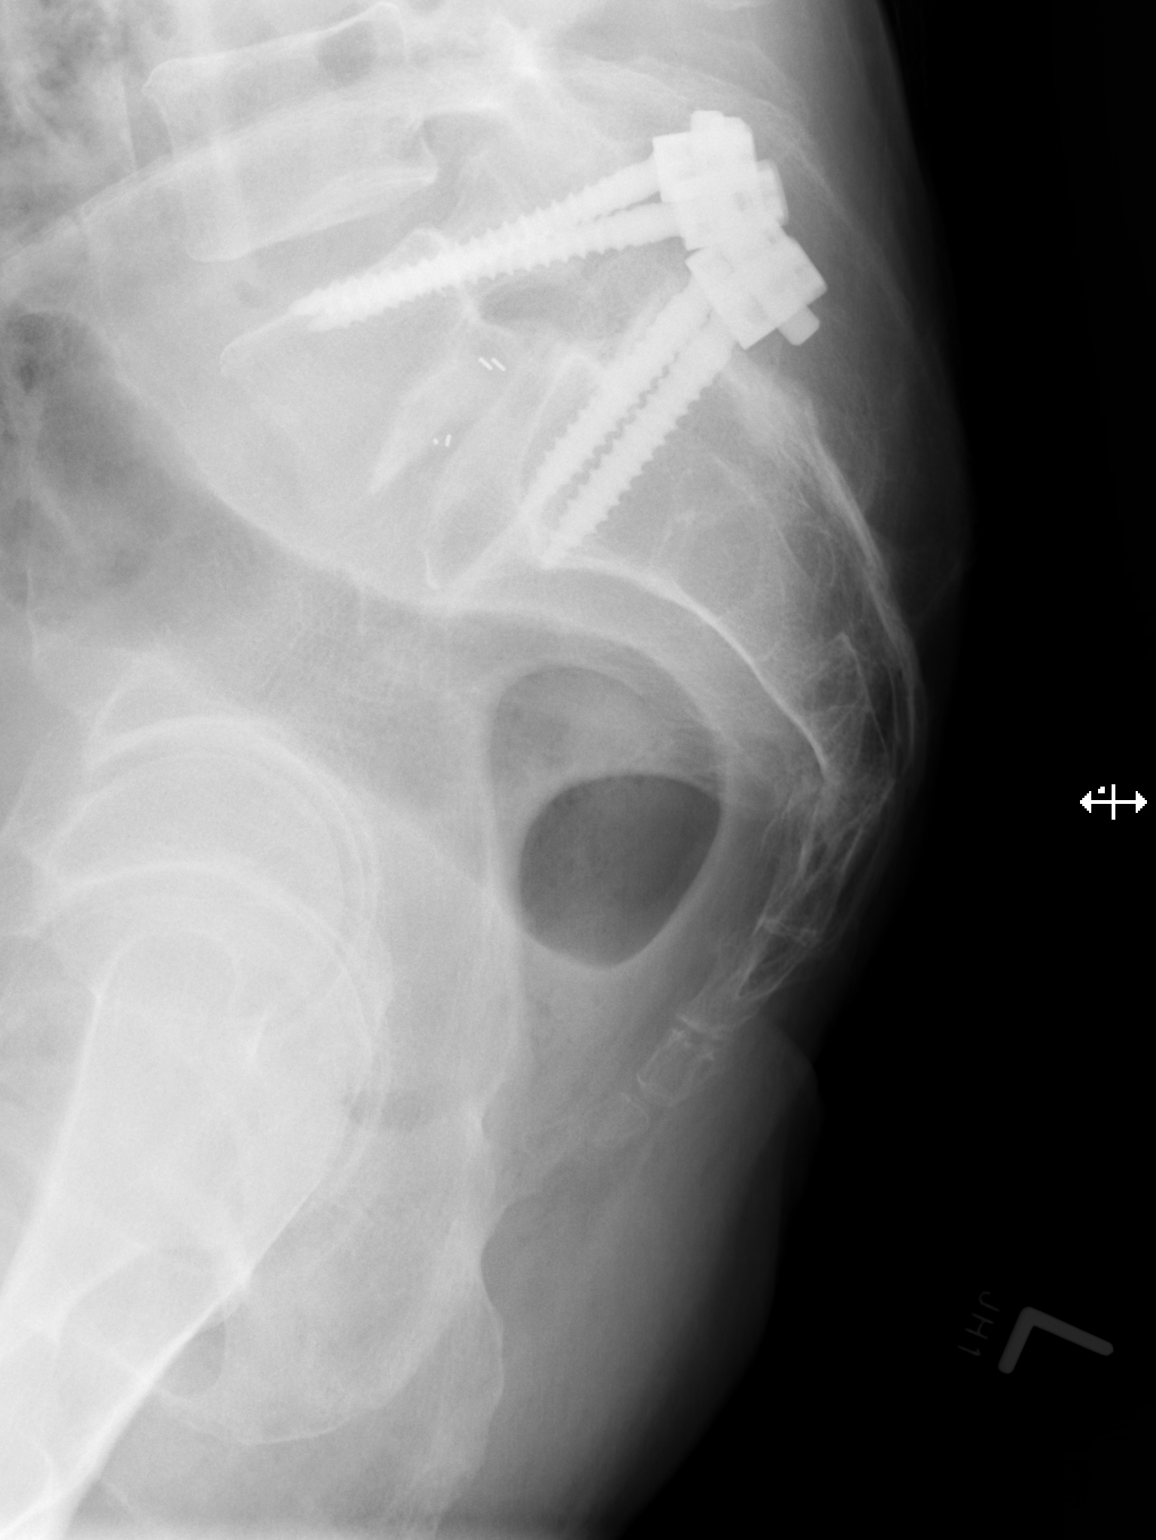

[3 of 3 positions shown; findings below may reference images not displayed]

FINDINGS: Prior posterior fusion at L5-S1. There appears to be a fracture
through the lower sacrum/upper coccyx region best seen on the
lateral view. No additional acute bony abnormality..
IMPRESSION: Fracture through the lower sacrum/upper coccyx region. This is seen
on the lateral view only.

## 2019-09-23 IMAGING — CR DG KNEE COMPLETE 4+V*L*
4 series · 4 of 4 positions shown · non-contrast
Comparison: None.

CLINICAL DATA: Patient here from home with complaints of fall
today. Reports left side hip and leg pain.

EXAM:
LEFT KNEE - COMPLETE 4+ VIEW

[t knee ap left]
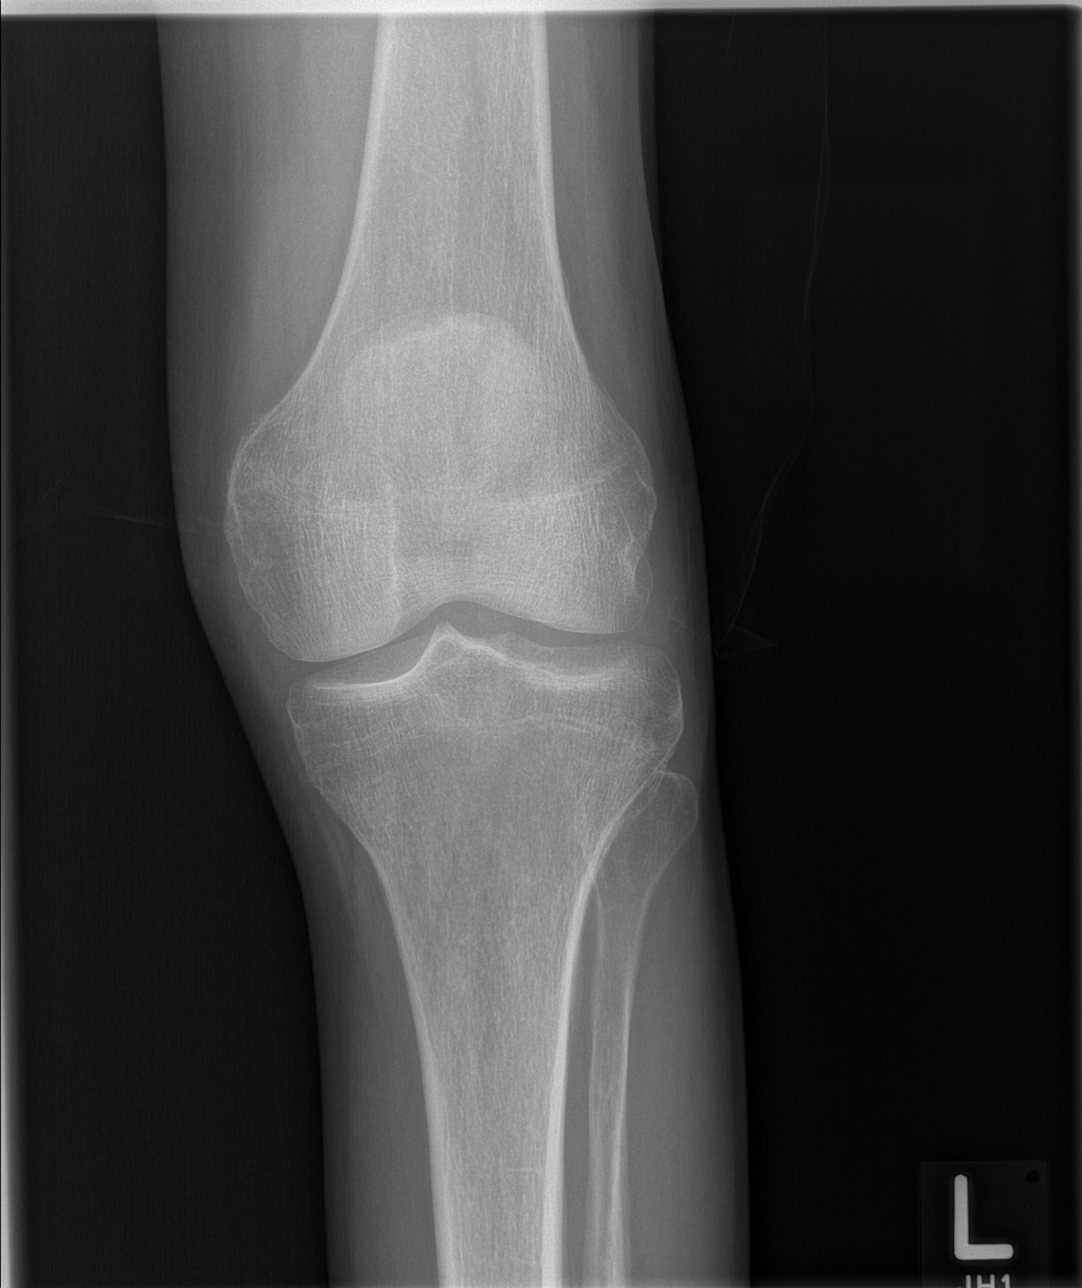

[t knee obl left (1 of 2)]
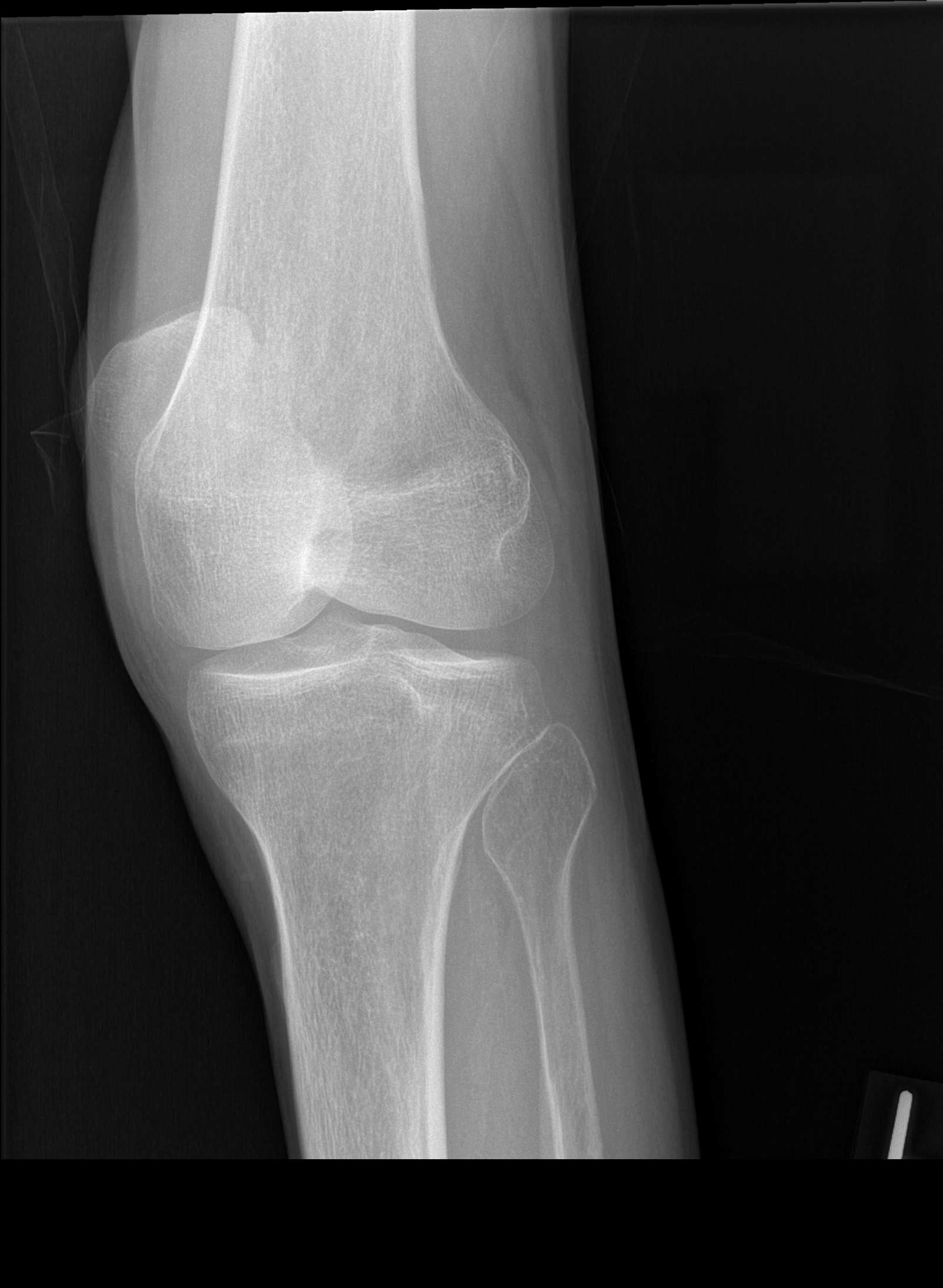

[t knee obl left (2 of 2)]
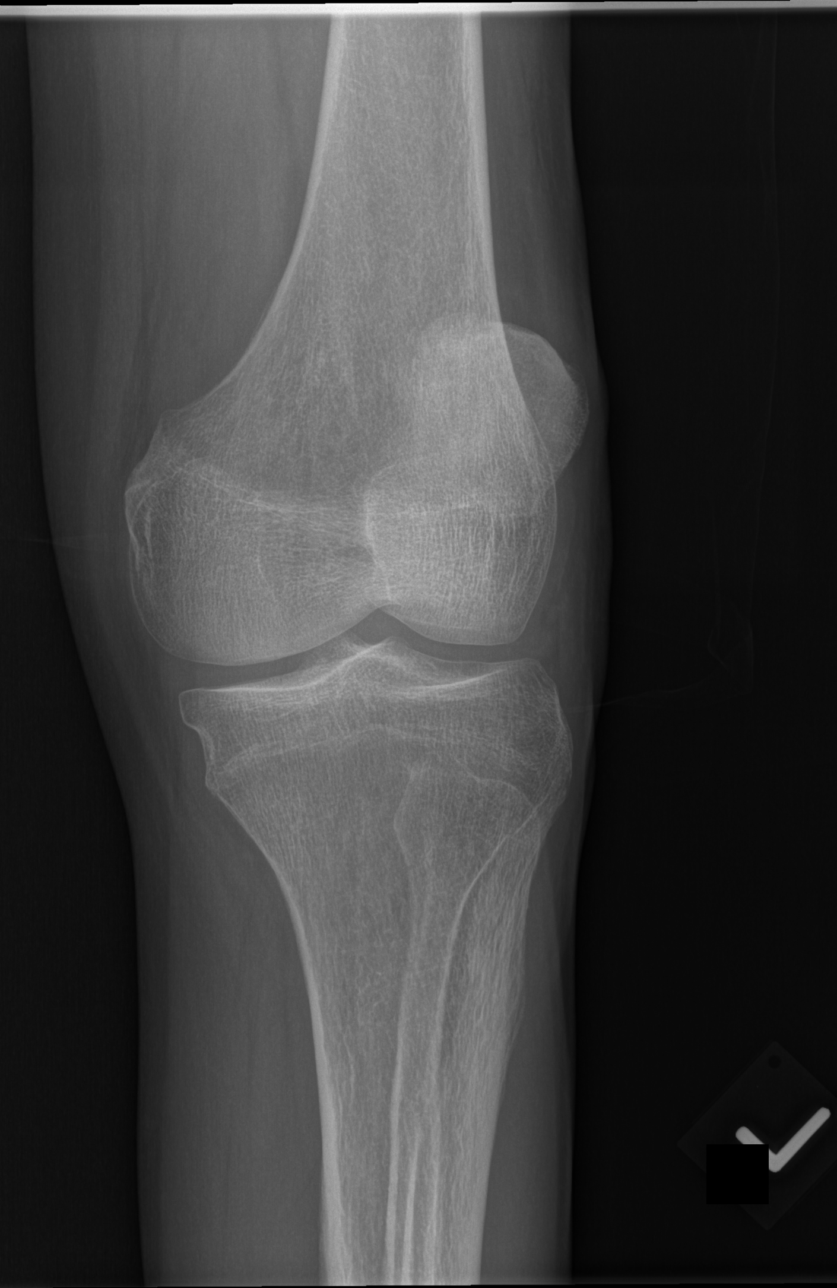

[t knee lat left]
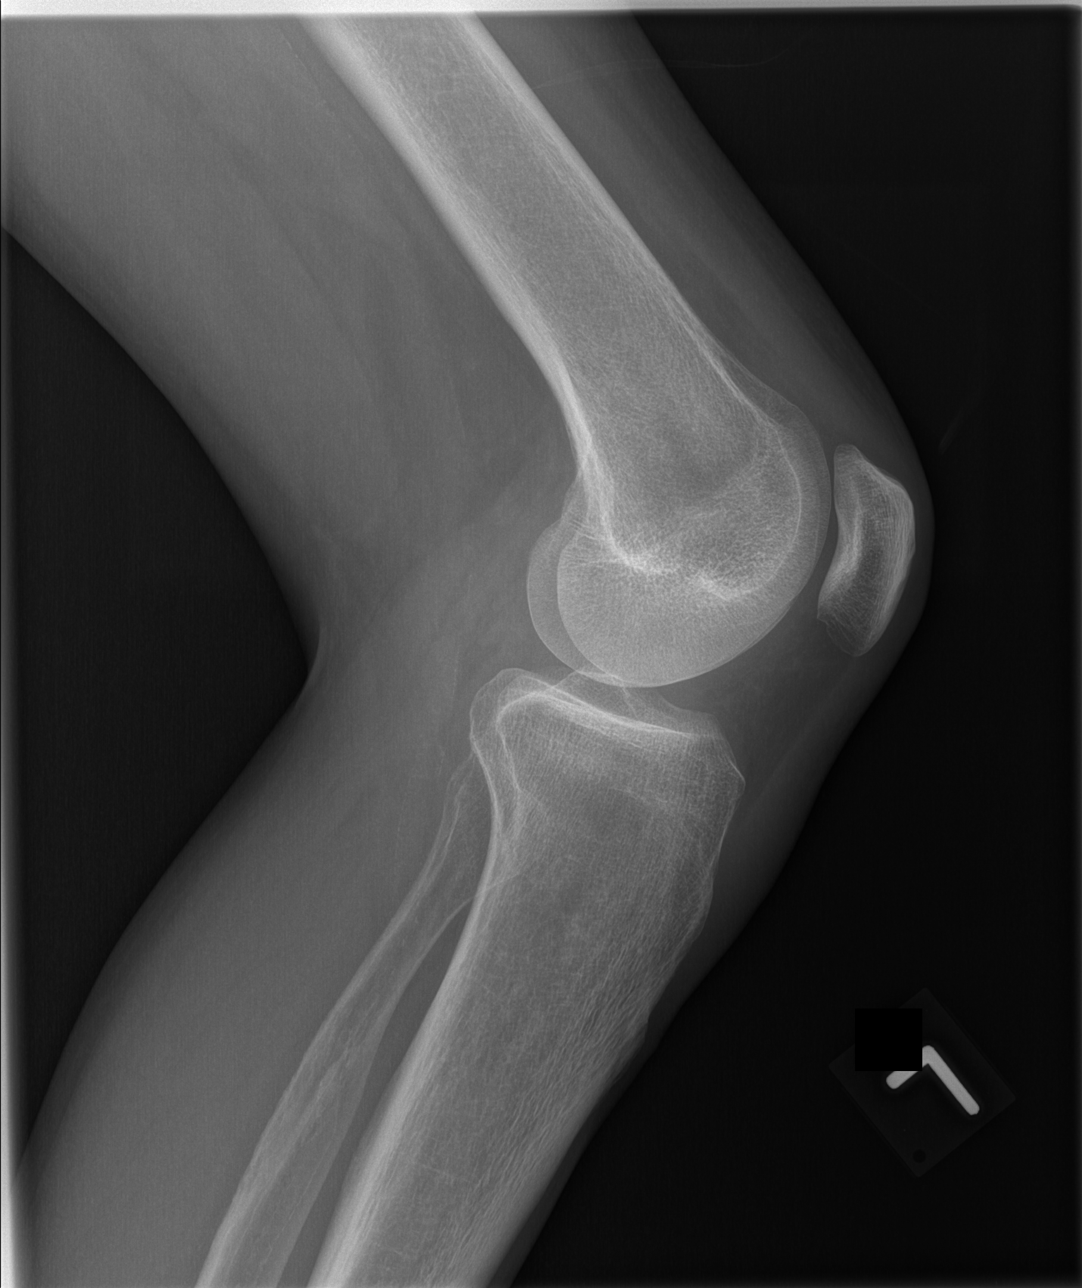

[4 of 4 positions shown; findings below may reference images not displayed]

FINDINGS: No evidence of fracture, dislocation, or joint effusion. No evidence
of arthropathy or other focal bone abnormality. Soft tissues are
unremarkable.
IMPRESSION: Negative.

## 2019-09-23 IMAGING — CT CT HEAD W/O CM
3 series · 16 of 47 positions shown, 19 images · non-contrast
Comparison: 07/24/2017

CLINICAL DATA: Unwitnessed fall.

EXAM:
CT HEAD WITHOUT CONTRAST
TECHNIQUE: Contiguous axial images were obtained from the base of the skull
through the vertex without intravenous contrast.

[Series 4: head wo · axial · 0.47mm/px · z∈[+1334,+1459]mm · 10 of 30 slices shown, 13 images]
[im 3/30  brain]
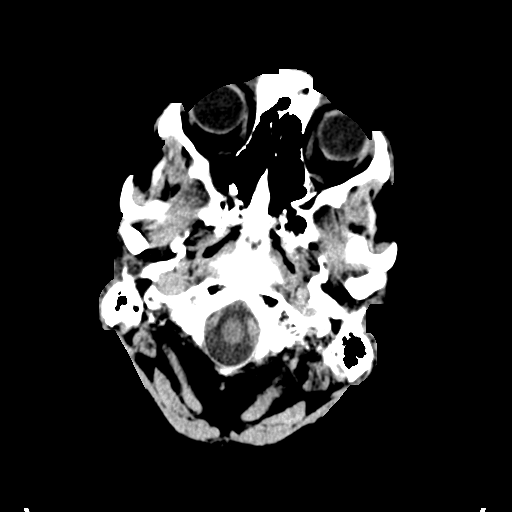
[im 3/30  bone]
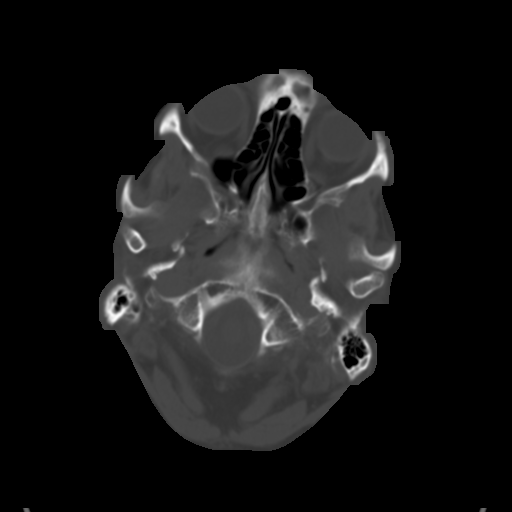
[im 6/30  brain]
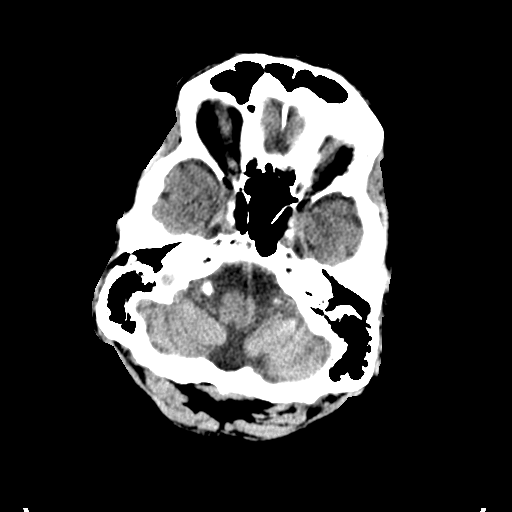
[im 9/30  brain]
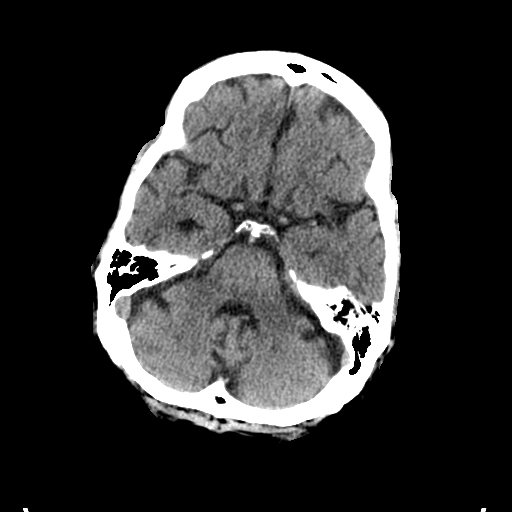
[im 11/30  brain]
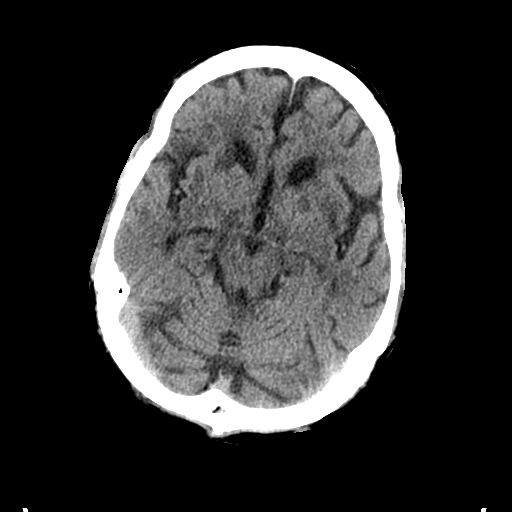
[im 14/30  brain]
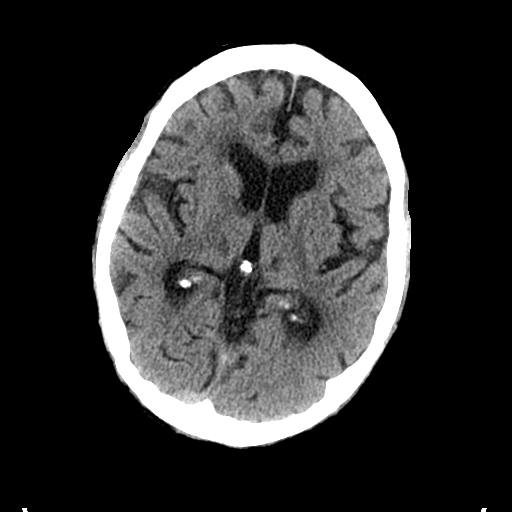
[im 14/30  bone]
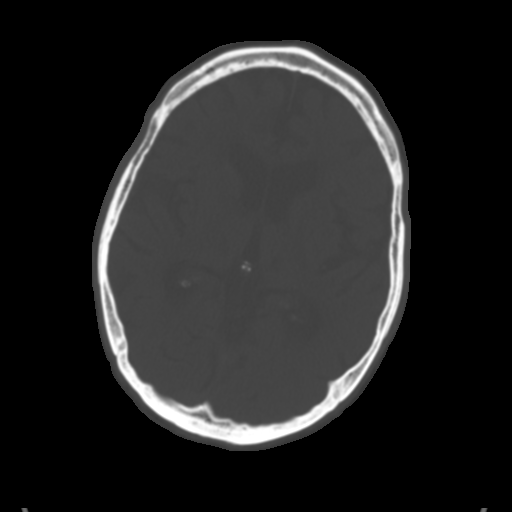
[im 17/30  brain]
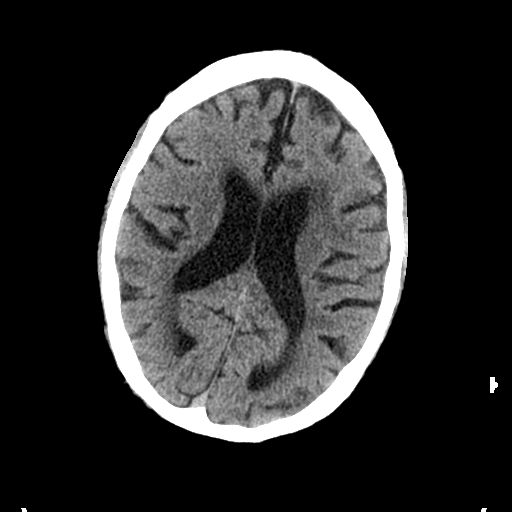
[im 20/30  brain]
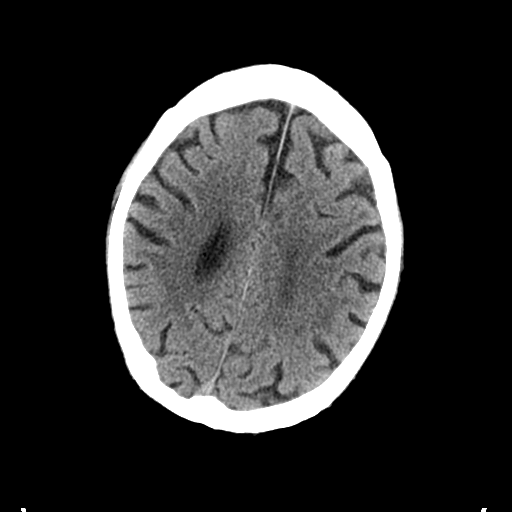
[im 23/30  brain]
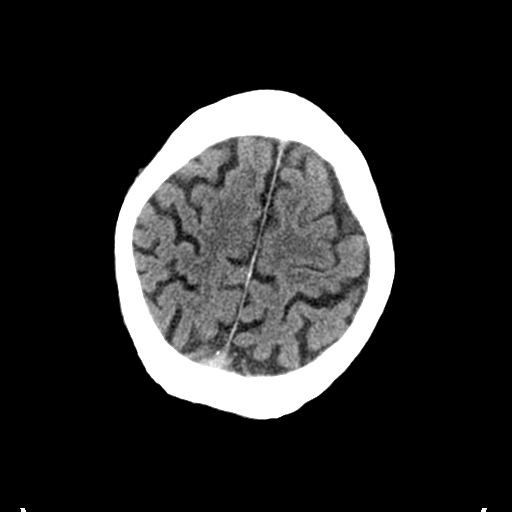
[im 25/30  brain]
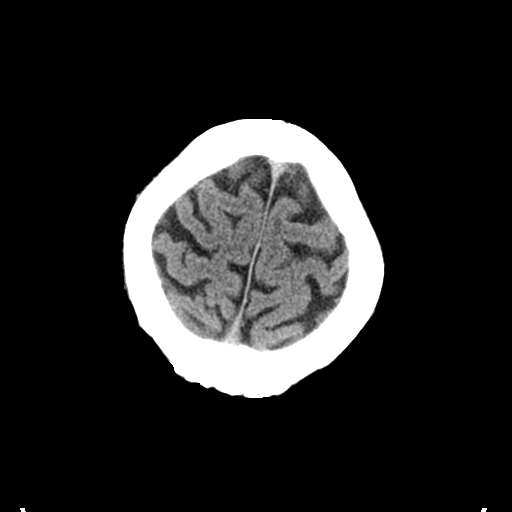
[im 25/30  bone]
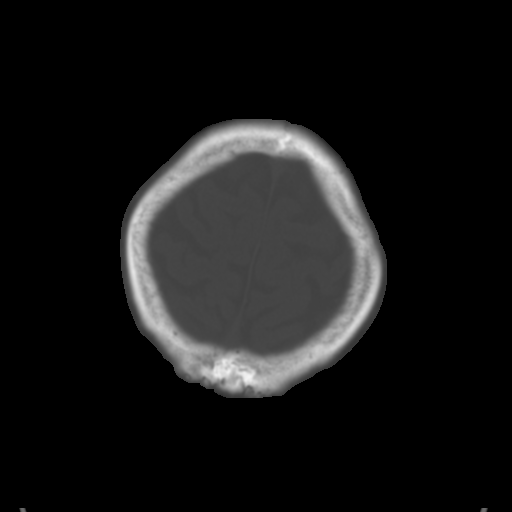
[im 28/30  brain]
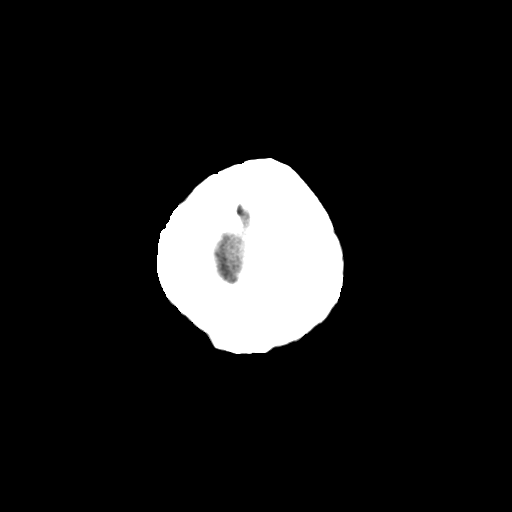

[Series 5: coronal soft tissue · coronal · 0.31mm/px · 3 of 71 slices shown]
[im 25/71  brain]
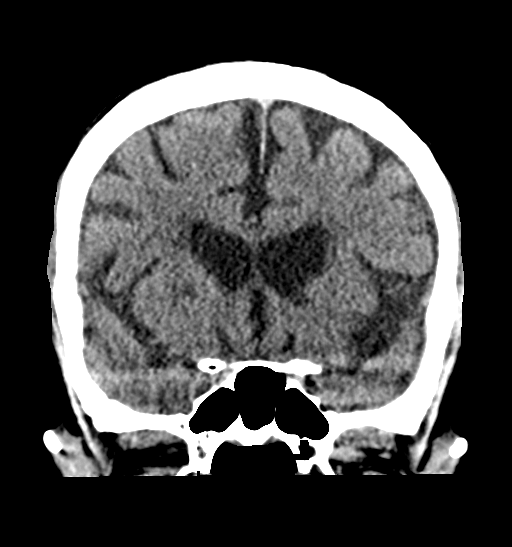
[im 32/71  brain]
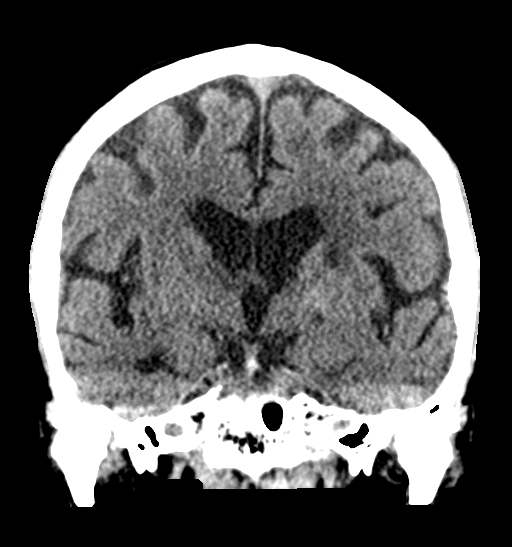
[im 39/71  brain]
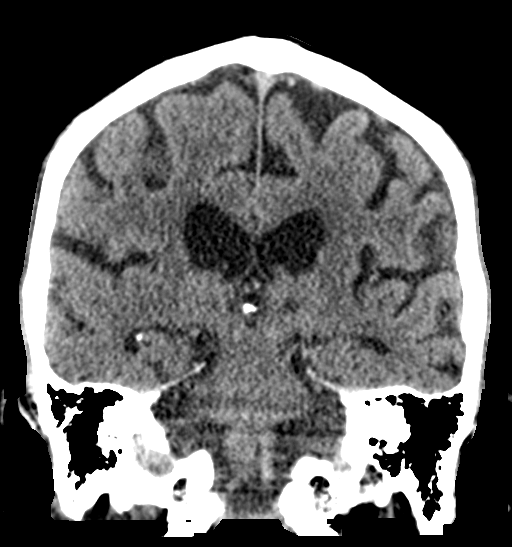

[Series 6: sagittal soft tissue · sagittal · 0.33mm/px · 3 of 54 slices shown]
[im 18/54  brain]
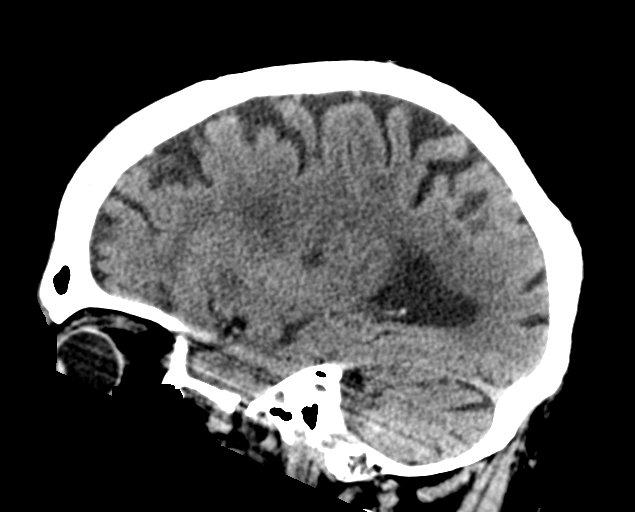
[im 27/54  brain]
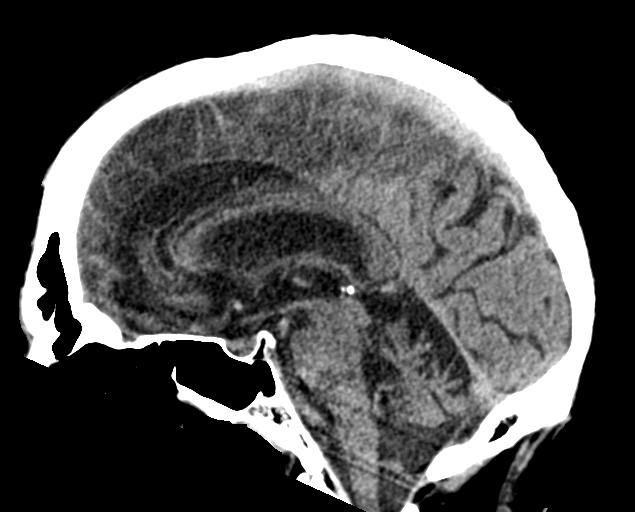
[im 36/54  brain]
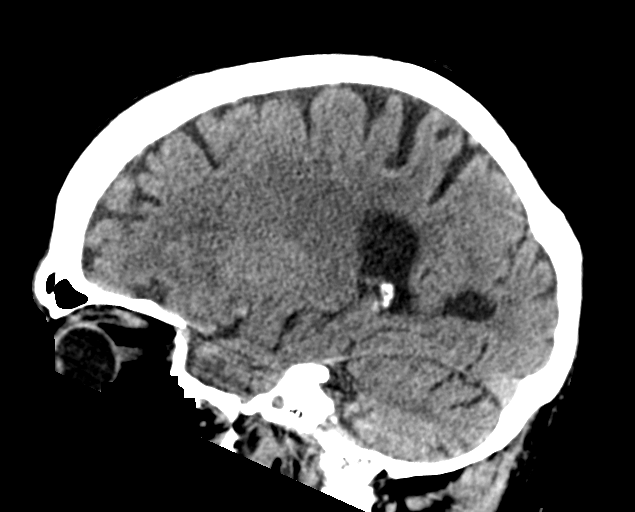

[16 of 47 positions shown; findings below may reference images not displayed]

FINDINGS: Brain: There is atrophy and chronic small vessel disease changes. No
acute intracranial abnormality. Specifically, no hemorrhage,
hydrocephalus, mass lesion, acute infarction, or significant
intracranial injury.

Vascular: No hyperdense vessel or unexpected calcification.

Skull: No acute calvarial abnormality.

Sinuses/Orbits: Visualized paranasal sinuses and mastoids clear.
Orbital soft tissues unremarkable.

Other: None
IMPRESSION: No acute intracranial abnormality.

Atrophy, chronic microvascular disease.

## 2020-08-04 ENCOUNTER — Ambulatory Visit: Payer: Medicaid Other | Attending: Physician Assistant | Admitting: Physical Therapy

## 2021-04-21 DEATH — deceased
